# Patient Record
Sex: Male | Born: 1940
Health system: Southern US, Community
[De-identification: ages and names within clinical notes are randomized; demographics above are authoritative.]

## PROBLEM LIST (undated history)

## (undated) DIAGNOSIS — R000 Tachycardia, unspecified: Secondary | ICD-10-CM

## (undated) DIAGNOSIS — I471 Supraventricular tachycardia: Principal | ICD-10-CM

## (undated) DIAGNOSIS — I10 Essential (primary) hypertension: Secondary | ICD-10-CM

## (undated) DIAGNOSIS — E119 Type 2 diabetes mellitus without complications: Secondary | ICD-10-CM

## (undated) DIAGNOSIS — R600 Localized edema: Secondary | ICD-10-CM

## (undated) DIAGNOSIS — T7840XA Allergy, unspecified, initial encounter: Secondary | ICD-10-CM

## (undated) HISTORY — DX: Tachycardia, unspecified: R00.0

## (undated) HISTORY — DX: Supraventricular tachycardia: I47.1

## (undated) HISTORY — DX: Localized edema: R60.0

---

## 2013-06-18 DIAGNOSIS — Z1389 Encounter for screening for other disorder: Secondary | ICD-10-CM | POA: Diagnosis not present

## 2013-06-18 DIAGNOSIS — IMO0002 Reserved for concepts with insufficient information to code with codable children: Secondary | ICD-10-CM | POA: Diagnosis not present

## 2013-06-18 DIAGNOSIS — I10 Essential (primary) hypertension: Secondary | ICD-10-CM | POA: Diagnosis not present

## 2013-09-24 DIAGNOSIS — H4011X0 Primary open-angle glaucoma, stage unspecified: Secondary | ICD-10-CM | POA: Diagnosis not present

## 2013-09-24 DIAGNOSIS — H01009 Unspecified blepharitis unspecified eye, unspecified eyelid: Secondary | ICD-10-CM | POA: Diagnosis not present

## 2013-09-24 DIAGNOSIS — H409 Unspecified glaucoma: Secondary | ICD-10-CM | POA: Diagnosis not present

## 2013-10-01 DIAGNOSIS — H4011X0 Primary open-angle glaucoma, stage unspecified: Secondary | ICD-10-CM | POA: Diagnosis not present

## 2013-10-08 DIAGNOSIS — H409 Unspecified glaucoma: Secondary | ICD-10-CM | POA: Diagnosis not present

## 2013-10-08 DIAGNOSIS — H4011X0 Primary open-angle glaucoma, stage unspecified: Secondary | ICD-10-CM | POA: Diagnosis not present

## 2013-10-15 DIAGNOSIS — H409 Unspecified glaucoma: Secondary | ICD-10-CM | POA: Diagnosis not present

## 2013-10-15 DIAGNOSIS — H4011X0 Primary open-angle glaucoma, stage unspecified: Secondary | ICD-10-CM | POA: Diagnosis not present

## 2013-12-13 DIAGNOSIS — H409 Unspecified glaucoma: Secondary | ICD-10-CM | POA: Diagnosis not present

## 2013-12-13 DIAGNOSIS — H01009 Unspecified blepharitis unspecified eye, unspecified eyelid: Secondary | ICD-10-CM | POA: Diagnosis not present

## 2013-12-13 DIAGNOSIS — H4011X0 Primary open-angle glaucoma, stage unspecified: Secondary | ICD-10-CM | POA: Diagnosis not present

## 2014-01-05 DIAGNOSIS — E559 Vitamin D deficiency, unspecified: Secondary | ICD-10-CM | POA: Diagnosis not present

## 2014-01-05 DIAGNOSIS — E785 Hyperlipidemia, unspecified: Secondary | ICD-10-CM | POA: Diagnosis not present

## 2014-01-05 DIAGNOSIS — E119 Type 2 diabetes mellitus without complications: Secondary | ICD-10-CM | POA: Diagnosis not present

## 2014-01-24 DIAGNOSIS — H01009 Unspecified blepharitis unspecified eye, unspecified eyelid: Secondary | ICD-10-CM | POA: Diagnosis not present

## 2014-01-24 DIAGNOSIS — H409 Unspecified glaucoma: Secondary | ICD-10-CM | POA: Diagnosis not present

## 2014-01-24 DIAGNOSIS — H4011X0 Primary open-angle glaucoma, stage unspecified: Secondary | ICD-10-CM | POA: Diagnosis not present

## 2014-04-04 DIAGNOSIS — Z1211 Encounter for screening for malignant neoplasm of colon: Secondary | ICD-10-CM | POA: Diagnosis not present

## 2014-04-04 DIAGNOSIS — R1011 Right upper quadrant pain: Secondary | ICD-10-CM | POA: Diagnosis not present

## 2014-04-04 DIAGNOSIS — M545 Low back pain: Secondary | ICD-10-CM | POA: Diagnosis not present

## 2014-04-04 DIAGNOSIS — E1165 Type 2 diabetes mellitus with hyperglycemia: Secondary | ICD-10-CM | POA: Diagnosis not present

## 2014-04-11 DIAGNOSIS — E1165 Type 2 diabetes mellitus with hyperglycemia: Secondary | ICD-10-CM | POA: Diagnosis not present

## 2014-04-11 DIAGNOSIS — Z23 Encounter for immunization: Secondary | ICD-10-CM | POA: Diagnosis not present

## 2014-10-11 DIAGNOSIS — E0800 Diabetes mellitus due to underlying condition with hyperosmolarity without nonketotic hyperglycemic-hyperosmolar coma (NKHHC): Secondary | ICD-10-CM | POA: Diagnosis not present

## 2014-10-11 DIAGNOSIS — I10 Essential (primary) hypertension: Secondary | ICD-10-CM | POA: Diagnosis not present

## 2014-10-11 DIAGNOSIS — E559 Vitamin D deficiency, unspecified: Secondary | ICD-10-CM | POA: Diagnosis not present

## 2014-10-11 DIAGNOSIS — G308 Other Alzheimer's disease: Secondary | ICD-10-CM | POA: Diagnosis not present

## 2014-10-11 DIAGNOSIS — H409 Unspecified glaucoma: Secondary | ICD-10-CM | POA: Diagnosis not present

## 2014-10-25 DIAGNOSIS — H409 Unspecified glaucoma: Secondary | ICD-10-CM | POA: Diagnosis not present

## 2014-10-25 DIAGNOSIS — G308 Other Alzheimer's disease: Secondary | ICD-10-CM | POA: Diagnosis not present

## 2014-10-25 DIAGNOSIS — E0800 Diabetes mellitus due to underlying condition with hyperosmolarity without nonketotic hyperglycemic-hyperosmolar coma (NKHHC): Secondary | ICD-10-CM | POA: Diagnosis not present

## 2014-10-25 DIAGNOSIS — I10 Essential (primary) hypertension: Secondary | ICD-10-CM | POA: Diagnosis not present

## 2014-12-06 DIAGNOSIS — H2513 Age-related nuclear cataract, bilateral: Secondary | ICD-10-CM | POA: Diagnosis not present

## 2014-12-06 DIAGNOSIS — H4011X4 Primary open-angle glaucoma, indeterminate stage: Secondary | ICD-10-CM | POA: Diagnosis not present

## 2014-12-26 DIAGNOSIS — I10 Essential (primary) hypertension: Secondary | ICD-10-CM | POA: Diagnosis not present

## 2015-01-21 DIAGNOSIS — H4011X1 Primary open-angle glaucoma, mild stage: Secondary | ICD-10-CM | POA: Diagnosis not present

## 2015-01-21 DIAGNOSIS — H2513 Age-related nuclear cataract, bilateral: Secondary | ICD-10-CM | POA: Diagnosis not present

## 2015-01-21 DIAGNOSIS — E119 Type 2 diabetes mellitus without complications: Secondary | ICD-10-CM | POA: Diagnosis not present

## 2015-02-04 DIAGNOSIS — I10 Essential (primary) hypertension: Secondary | ICD-10-CM | POA: Diagnosis not present

## 2015-02-04 DIAGNOSIS — E0800 Diabetes mellitus due to underlying condition with hyperosmolarity without nonketotic hyperglycemic-hyperosmolar coma (NKHHC): Secondary | ICD-10-CM | POA: Diagnosis not present

## 2015-02-04 DIAGNOSIS — G308 Other Alzheimer's disease: Secondary | ICD-10-CM | POA: Diagnosis not present

## 2015-05-05 DIAGNOSIS — E0800 Diabetes mellitus due to underlying condition with hyperosmolarity without nonketotic hyperglycemic-hyperosmolar coma (NKHHC): Secondary | ICD-10-CM | POA: Diagnosis not present

## 2015-05-05 DIAGNOSIS — E559 Vitamin D deficiency, unspecified: Secondary | ICD-10-CM | POA: Diagnosis not present

## 2015-05-05 DIAGNOSIS — G308 Other Alzheimer's disease: Secondary | ICD-10-CM | POA: Diagnosis not present

## 2015-05-05 DIAGNOSIS — H409 Unspecified glaucoma: Secondary | ICD-10-CM | POA: Diagnosis not present

## 2015-05-05 DIAGNOSIS — I10 Essential (primary) hypertension: Secondary | ICD-10-CM | POA: Diagnosis not present

## 2015-05-05 DIAGNOSIS — E089 Diabetes mellitus due to underlying condition without complications: Secondary | ICD-10-CM | POA: Diagnosis not present

## 2015-07-25 DIAGNOSIS — H401131 Primary open-angle glaucoma, bilateral, mild stage: Secondary | ICD-10-CM | POA: Diagnosis not present

## 2015-07-25 DIAGNOSIS — H2513 Age-related nuclear cataract, bilateral: Secondary | ICD-10-CM | POA: Diagnosis not present

## 2015-07-25 DIAGNOSIS — E119 Type 2 diabetes mellitus without complications: Secondary | ICD-10-CM | POA: Diagnosis not present

## 2015-08-05 DIAGNOSIS — H409 Unspecified glaucoma: Secondary | ICD-10-CM | POA: Diagnosis not present

## 2015-08-05 DIAGNOSIS — E089 Diabetes mellitus due to underlying condition without complications: Secondary | ICD-10-CM | POA: Diagnosis not present

## 2015-08-05 DIAGNOSIS — I10 Essential (primary) hypertension: Secondary | ICD-10-CM | POA: Diagnosis not present

## 2015-08-05 DIAGNOSIS — G308 Other Alzheimer's disease: Secondary | ICD-10-CM | POA: Diagnosis not present

## 2015-12-03 DIAGNOSIS — H409 Unspecified glaucoma: Secondary | ICD-10-CM | POA: Diagnosis not present

## 2015-12-03 DIAGNOSIS — I10 Essential (primary) hypertension: Secondary | ICD-10-CM | POA: Diagnosis not present

## 2015-12-03 DIAGNOSIS — J301 Allergic rhinitis due to pollen: Secondary | ICD-10-CM | POA: Diagnosis not present

## 2015-12-03 DIAGNOSIS — E089 Diabetes mellitus due to underlying condition without complications: Secondary | ICD-10-CM | POA: Diagnosis not present

## 2016-01-23 DIAGNOSIS — H2513 Age-related nuclear cataract, bilateral: Secondary | ICD-10-CM | POA: Diagnosis not present

## 2016-01-23 DIAGNOSIS — H401131 Primary open-angle glaucoma, bilateral, mild stage: Secondary | ICD-10-CM | POA: Diagnosis not present

## 2016-01-23 DIAGNOSIS — E119 Type 2 diabetes mellitus without complications: Secondary | ICD-10-CM | POA: Diagnosis not present

## 2016-04-01 DIAGNOSIS — I10 Essential (primary) hypertension: Secondary | ICD-10-CM | POA: Diagnosis not present

## 2016-04-01 DIAGNOSIS — H409 Unspecified glaucoma: Secondary | ICD-10-CM | POA: Diagnosis not present

## 2016-04-01 DIAGNOSIS — E089 Diabetes mellitus due to underlying condition without complications: Secondary | ICD-10-CM | POA: Diagnosis not present

## 2016-04-01 DIAGNOSIS — G308 Other Alzheimer's disease: Secondary | ICD-10-CM | POA: Diagnosis not present

## 2016-07-16 DIAGNOSIS — E119 Type 2 diabetes mellitus without complications: Secondary | ICD-10-CM | POA: Diagnosis not present

## 2016-07-16 DIAGNOSIS — H2513 Age-related nuclear cataract, bilateral: Secondary | ICD-10-CM | POA: Diagnosis not present

## 2016-07-16 DIAGNOSIS — H401131 Primary open-angle glaucoma, bilateral, mild stage: Secondary | ICD-10-CM | POA: Diagnosis not present

## 2016-07-29 DIAGNOSIS — E0800 Diabetes mellitus due to underlying condition with hyperosmolarity without nonketotic hyperglycemic-hyperosmolar coma (NKHHC): Secondary | ICD-10-CM | POA: Diagnosis not present

## 2016-07-29 DIAGNOSIS — E089 Diabetes mellitus due to underlying condition without complications: Secondary | ICD-10-CM | POA: Diagnosis not present

## 2016-07-29 DIAGNOSIS — I10 Essential (primary) hypertension: Secondary | ICD-10-CM | POA: Diagnosis not present

## 2016-07-29 DIAGNOSIS — H409 Unspecified glaucoma: Secondary | ICD-10-CM | POA: Diagnosis not present

## 2016-10-17 ENCOUNTER — Encounter (HOSPITAL_COMMUNITY): Payer: Self-pay

## 2016-10-17 ENCOUNTER — Observation Stay (HOSPITAL_COMMUNITY)
Admission: EM | Admit: 2016-10-17 | Discharge: 2016-10-18 | Disposition: A | Discharge status: Home / Self Care | Payer: Medicare Other | Attending: Family Medicine | Admitting: Family Medicine

## 2016-10-17 ENCOUNTER — Emergency Department (HOSPITAL_COMMUNITY): Payer: Medicare Other

## 2016-10-17 DIAGNOSIS — I10 Essential (primary) hypertension: Secondary | ICD-10-CM | POA: Diagnosis not present

## 2016-10-17 DIAGNOSIS — J302 Other seasonal allergic rhinitis: Secondary | ICD-10-CM | POA: Insufficient documentation

## 2016-10-17 DIAGNOSIS — R079 Chest pain, unspecified: Secondary | ICD-10-CM

## 2016-10-17 DIAGNOSIS — R0602 Shortness of breath: Secondary | ICD-10-CM | POA: Diagnosis not present

## 2016-10-17 DIAGNOSIS — E119 Type 2 diabetes mellitus without complications: Secondary | ICD-10-CM | POA: Diagnosis not present

## 2016-10-17 DIAGNOSIS — Z8249 Family history of ischemic heart disease and other diseases of the circulatory system: Secondary | ICD-10-CM | POA: Diagnosis not present

## 2016-10-17 DIAGNOSIS — R05 Cough: Secondary | ICD-10-CM | POA: Diagnosis not present

## 2016-10-17 DIAGNOSIS — E118 Type 2 diabetes mellitus with unspecified complications: Secondary | ICD-10-CM | POA: Diagnosis not present

## 2016-10-17 DIAGNOSIS — R0789 Other chest pain: Principal | ICD-10-CM | POA: Insufficient documentation

## 2016-10-17 DIAGNOSIS — Z7984 Long term (current) use of oral hypoglycemic drugs: Secondary | ICD-10-CM | POA: Insufficient documentation

## 2016-10-17 HISTORY — DX: Type 2 diabetes mellitus without complications: E11.9

## 2016-10-17 HISTORY — DX: Essential (primary) hypertension: I10

## 2016-10-17 HISTORY — DX: Allergy, unspecified, initial encounter: T78.40XA

## 2016-10-17 LAB — BASIC METABOLIC PANEL
ANION GAP: 8 (ref 5–15)
BUN: 11 mg/dL (ref 6–20)
CALCIUM: 8.9 mg/dL (ref 8.9–10.3)
CHLORIDE: 104 mmol/L (ref 101–111)
CO2: 25 mmol/L (ref 22–32)
Creatinine, Ser: 0.95 mg/dL (ref 0.61–1.24)
GFR calc non Af Amer: >60 mL/min (ref >60)
Glucose, Bld: 161 mg/dL — ABNORMAL HIGH (ref 65–99)
Potassium: 3.9 mmol/L (ref 3.5–5.1)
Sodium: 137 mmol/L (ref 135–145)

## 2016-10-17 LAB — CBC
HCT: 39.6 % (ref 39.0–52.0)
HEMOGLOBIN: 13.8 g/dL (ref 13.0–17.0)
MCH: 32 pg (ref 26.0–34.0)
MCHC: 34.8 g/dL (ref 30.0–36.0)
MCV: 91.9 fL (ref 78.0–100.0)
Platelets: 210 10*3/uL (ref 150–400)
RBC: 4.31 MIL/uL (ref 4.22–5.81)
RDW: 12.2 % (ref 11.5–15.5)
WBC: 6.2 10*3/uL (ref 4.0–10.5)

## 2016-10-17 LAB — TROPONIN I: Troponin I: <0.03 ng/mL (ref <0.03)

## 2016-10-17 LAB — GLUCOSE, CAPILLARY
GLUCOSE-CAPILLARY: 120 mg/dL — AB (ref 65–99)
GLUCOSE-CAPILLARY: 111 mg/dL — AB (ref 65–99)

## 2016-10-17 LAB — I-STAT TROPONIN, ED
TROPONIN I, POC: 0 ng/mL (ref 0.00–0.08)
TROPONIN I, POC: 0 ng/mL (ref 0.00–0.08)

## 2016-10-17 LAB — LIPID PANEL
Cholesterol: 102 mg/dL (ref 0–200)
HDL: 51 mg/dL (ref >40)
LDL CALC: 44 mg/dL (ref 0–99)
TRIGLYCERIDES: 35 mg/dL (ref <150)
Total CHOL/HDL Ratio: 2 RATIO
VLDL: 7 mg/dL (ref 0–40)

## 2016-10-17 LAB — BRAIN NATRIURETIC PEPTIDE: B NATRIURETIC PEPTIDE 5: 10.6 pg/mL (ref 0.0–100.0)

## 2016-10-17 LAB — D-DIMER, QUANTITATIVE (NOT AT ARMC): D DIMER QUANT: 0.35 ug{FEU}/mL (ref 0.00–0.50)

## 2016-10-17 LAB — TSH: TSH: 0.573 u[IU]/mL (ref 0.350–4.500)

## 2016-10-17 MED ORDER — ENOXAPARIN SODIUM 40 MG/0.4ML ~~LOC~~ SOLN
40.0000 mg | SUBCUTANEOUS | Status: DC
  Administered 2016-10-17: 40 mg via SUBCUTANEOUS
  Filled 2016-10-17: qty 0.4

## 2016-10-17 MED ORDER — ASPIRIN 81 MG PO CHEW
324.0000 mg | CHEWABLE_TABLET | Freq: Once | ORAL | Status: AC
  Administered 2016-10-17: 324 mg via ORAL
  Filled 2016-10-17: qty 4

## 2016-10-17 MED ORDER — MORPHINE SULFATE (PF) 4 MG/ML IV SOLN
2.0000 mg | INTRAVENOUS | Status: DC | PRN
Start: 2016-10-17 — End: 2016-10-18

## 2016-10-17 MED ORDER — GI COCKTAIL ~~LOC~~: 30.0000 mL | Freq: Four times a day (QID) | ORAL | Status: DC | PRN

## 2016-10-17 MED ORDER — GI COCKTAIL ~~LOC~~: 30.0000 mL | Freq: Once | ORAL | Status: DC

## 2016-10-17 MED ORDER — PANTOPRAZOLE SODIUM 40 MG PO TBEC
40.0000 mg | DELAYED_RELEASE_TABLET | Freq: Every day | ORAL | Status: DC
  Administered 2016-10-18: 40 mg via ORAL
  Filled 2016-10-17: qty 1

## 2016-10-17 MED ORDER — ACETAMINOPHEN 325 MG PO TABS: 650.0000 mg | ORAL_TABLET | ORAL | Status: DC | PRN

## 2016-10-17 MED ORDER — ASPIRIN 81 MG PO CHEW
81.0000 mg | CHEWABLE_TABLET | Freq: Every day | ORAL | Status: DC
  Administered 2016-10-18: 81 mg via ORAL
  Filled 2016-10-17: qty 1

## 2016-10-17 MED ORDER — INSULIN ASPART 100 UNIT/ML ~~LOC~~ SOLN: 0.0000 [IU] | Freq: Three times a day (TID) | SUBCUTANEOUS | Status: DC

## 2016-10-17 MED ORDER — HYDROCHLOROTHIAZIDE 12.5 MG PO CAPS
12.5000 mg | ORAL_CAPSULE | Freq: Every day | ORAL | Status: DC
  Administered 2016-10-17 – 2016-10-18 (×2): 12.5 mg via ORAL
  Filled 2016-10-17 (×2): qty 1

## 2016-10-17 MED ORDER — LISINOPRIL 10 MG PO TABS
20.0000 mg | ORAL_TABLET | Freq: Every day | ORAL | Status: DC
  Administered 2016-10-17 – 2016-10-18 (×2): 20 mg via ORAL
  Filled 2016-10-17 (×2): qty 2

## 2016-10-17 MED ORDER — LISINOPRIL-HYDROCHLOROTHIAZIDE 20-12.5 MG PO TABS: 1.0000 | ORAL_TABLET | Freq: Every day | ORAL | Status: DC

## 2016-10-17 MED ORDER — LORATADINE 10 MG PO TABS
10.0000 mg | ORAL_TABLET | Freq: Every day | ORAL | Status: DC
  Administered 2016-10-17 – 2016-10-18 (×2): 10 mg via ORAL
  Filled 2016-10-17 (×2): qty 1

## 2016-10-17 MED ORDER — ONDANSETRON HCL 4 MG/2ML IJ SOLN: 4.0000 mg | Freq: Four times a day (QID) | INTRAMUSCULAR | Status: DC | PRN

## 2016-10-17 NOTE — Plan of Care (Signed)
Problem: Education: Goal: Knowledge of [facility] Education information/materials will improve Outcome: Progressing Slight language barrier, will use interpreter if needed.

## 2016-10-17 NOTE — ED Notes (Signed)
ED Provider at bedside. 

## 2016-10-17 NOTE — ED Triage Notes (Signed)
Patient complains of left anterior chest heaviness with dizziness x 2 days. States that the discomfort is worse in the am. The fullness worth with inspiration. No cough or cold. Also complains of bilateral lower extremity swelling x 1 week.

## 2016-10-17 NOTE — H&P (Signed)
[facility] History and Physical Service Pager: [phone]  Patient name: Gavin PottersJohnchong Hamil Medical record number: [id number] Date of birth: 09/24/1940 Age: 76 y.o. Gender: male  Primary Care Provider: Opal SidlesBlount, Thomas J, MD Consultants: none Code Status: FULL (discussed on admission)  Chief Complaint: Chest pressure  Assessment and Plan: Gavin PottersJohnchong Branum is a 76 y.o. male presenting with chest pressure . PMH is significant for HTN, DM2, seasonal allergies  # Chest pain, atypical.  VSS on room air.  No active CP.  He is sinus rhythm on telemetry. Heart score 4.  iTrop negative x2.  EKG electronic read with nonspecific ST changes.  However, I see no evidence of ischemia and ED provider read as no ischemia.  DDimer 0.35 (neg).  CXR neg for acute processes.  So far, negative workup for ACS.  He has some symptoms that are concerning for uncontrolled GERD, so PPI and GI cocktail have been added. - Place in observation under Dr Jennette KettleNeal - VS per floor protocol - Telemetry - cycle troponin - check lipid, A1c, TSH - repeat EKG in am - if abnormal c/s cards in am otherwise, consider outpatient cards referral for stress test - ASA 81 mg qd, Morphine IV prn - GI cocktail  # HTN: slightly elevated in ED 147/97.  Has not had antihypertensive today - resume Lisinopril/ HCTZ 20/12.5  # DM2: on glimperide 4mg  at home qd - sSSI while hospitalized - BG monitoring QAC.  Can discontinue if controlled.  FEN/GI: SLIV, PPI Prophylaxis: Lovenox sub-q  Disposition: Observation for ACS r/o  History of Present Illness:  Kion Cherly HensenChang is a 76 y.o. male presenting with chest pressure  Patient reports a 3 day history of chest pressure upon waking each morning.  He reports that chest pressure is located on the left side of his chest.  He denies associated N/V, SOB.  Reports some facial sweating this morning.  He notes that CP gradually improves as he goes about his day.  He  denies CP w/ activity or decreased tolerance of physical activity. He reports he is very active, walking up to 3 miles daily. Chest pressure is somewhat better at this time.  He reports bloating and excessive mucus production that is mildly improved with Allegra.     He reports compliance with home medications for DM2 and HTN.  Last doses were yesterday evening.    He reports that his father passed away from MI at age 77.  No other family member of heart disease that he knows of.   He has never been a smoker.  Does not drink ETOH or use illicit substances.  He resides with his wife at home, who is in good health.  He is completely independent in his ADLs and iADLs.  Review Of Systems: Per HPI with the following additions: none  ROS  Patient Active Problem List   Diagnosis Date Noted  . Chest pain 10/17/2016  . Type 2 diabetes mellitus (HCC) 10/17/2016  . Hypertension 10/17/2016  . Seasonal allergies 10/17/2016    Past Medical History: Past Medical History:  Diagnosis Date  . Diabetes mellitus without complication (HCC)   . Hypertension   . seasonal     Past Surgical History: History reviewed. No pertinent surgical history.  Social History: Social History  Substance Use Topics  . Smoking status: Never Smoker  . Smokeless tobacco: Never Used  . Alcohol use No   Additional social history:   Please also refer to relevant sections of  EMR.  Family History: Family History  Problem Relation Age of Onset  . Heart disease Father    Allergies and Medications: No Known Allergies No current facility-administered medications on file prior to encounter.    No current outpatient prescriptions on file prior to encounter.    Objective: BP (!) 147/97   Pulse 81   Temp 98.1 F (36.7 C) (Oral)   Resp (!) 23   Ht 5\' 6"  (1.676 m)   Wt 140 lb (63.5 kg)   SpO2 97%   BMI 22.60 kg/m  Exam: General: awake, alert, well appearing, NAD, lying in bed Eyes: EOMI, sclera white ENTM:  o/p clear, MMM, normal dentition Neck: supple, no JVD, no masses Cardiovascular: RRR, no murmurs Respiratory: CTAB, normal WOB on room air Gastrointestinal: soft, NT/ND, +BS MSK: moves all extremities independently, no edema Derm: skin without lesions Neuro: follows all commands, no focal neurologic deficits Psych: mood stable, speech normal, good eye contact  Labs and Imaging: CBC BMET   Recent Labs Lab 10/17/16 0744  WBC 6.2  HGB 13.8  HCT 39.6  PLT 210    Recent Labs Lab 10/17/16 0744  NA 137  K 3.9  CL 104  CO2 25  BUN 11  CREATININE 0.95  GLUCOSE 161*  CALCIUM 8.9     Cardiac Panel (last 3 results) No results for input(s): CKTOTAL, CKMB, TROPONINI, RELINDX in the last 72 hours.  Dg Chest 2 View  Result Date: 10/17/2016 CLINICAL DATA:  Patient complains of left anterior chest heaviness with dizziness and SOB x 2 days. States that the discomfort is worse in the am. The fullness worse with inspiration. No cough or cold. Also complains of bilateral lower extremity swelling for 1 week. History of diabetes and hypertension. EXAM: CHEST  2 VIEW COMPARISON:  None. FINDINGS: Heart size is normal. Overall cardiomediastinal silhouette is within normal limits in size, with probable age related aortic ectasia. Lungs are clear. No pleural effusion or pneumothorax seen. Mild degenerative spurring noted within the thoracic and upper lumbar spine. No acute or suspicious osseous finding. IMPRESSION: No active cardiopulmonary disease.  No evidence of pneumonia or CHF. Electronically Signed   By: Bary Richard M.D.   On: 10/17/2016 07:47    Raliegh Ip, DO 10/17/2016, 1:54 PM PGY-3, [facility] FPTS Intern pager: [phone], text pages welcome

## 2016-10-17 NOTE — ED Notes (Signed)
Attempted to call report x 1  

## 2016-10-17 NOTE — ED Provider Notes (Signed)
[facility]-EMERGENCY DEPT Provider Note   CSN: [id number] Arrival date & time: 10/17/16  0720     History   Chief Complaint Chief Complaint  Patient presents with  . Chest Pain  . Dizziness    HPI Wayne Walker is a 76 y.o. male.  The history is provided by the patient.  Chest Pain   This is a new problem. The current episode started more than 2 days ago. The problem occurs daily. The problem has been gradually improving. The pain is associated with breathing. The pain is present in the lateral region. The pain is mild. The quality of the pain is described as dull and pressure-like. The pain does not radiate. Duration of episode(s) is 20 minutes. The symptoms are aggravated by deep breathing. Associated symptoms include cough, dizziness, lower extremity edema and shortness of breath. Pertinent negatives include no diaphoresis, no fever, no leg pain, no nausea, no sputum production, no syncope and no vomiting. He has tried nothing for the symptoms. The treatment provided no relief. Risk factors include being elderly and male gender.  His past medical history is significant for diabetes and hypertension.  His family medical history is significant for CAD.  Dizziness  Associated symptoms: chest pain and shortness of breath   Associated symptoms: no nausea, no syncope and no vomiting    76 year old male who presents with chest pain. He has a history of hypertension and diabetes. No prior history of cardiac problems. States that over the past 2-3 days he has been waking up with left-sided chest pain that is dull in nature. He feels it is more uncomfortable when he takes a deep breath in. Pain is nonradiating. It is not associated with nausea, vomiting, diaphoresis. States that he has been having dizziness, but this has been ongoing for several years and unchanged. Has noted some intermittent lower extremity/pedal edema. No orthopnea or PND. Mild cough but no fevers or chills or other upper  respiratory symptoms. Symptoms typically last for 20 minutes or so and then self resolved. States that he does stair climbing for exercise normally, and has not had similar symptoms with that. No recent immobilization. No prior history of PE/DVT or family history of that.  Past Medical History:  Diagnosis Date  . Diabetes mellitus without complication (HCC)   . Hypertension     There are no active problems to display for this patient.   History reviewed. No pertinent surgical history.     Home Medications    Prior to Admission medications   Medication Sig Start Date End Date Taking? Authorizing Provider  Fexofenadine HCl (ALLEGRA PO) Take 1 tablet by mouth daily as needed (for allergies).   Yes [provider]  glimepiride (AMARYL) 4 MG tablet Take 4 mg by mouth daily. 08/21/16  Yes [provider]  lisinopril-hydrochlorothiazide (PRINZIDE,ZESTORETIC) 20-12.5 MG tablet Take 1 tablet by mouth daily. 10/01/16  Yes [provider]  LUMIGAN 0.01 % SOLN Place 1 drop into both eyes 2 (two) times a week.  08/05/16  Yes [provider]    Family History No family history on file. Father with multiple MI's and died at age 60  Social History Social History  Substance Use Topics  . Smoking status: Never Smoker  . Smokeless tobacco: Never Used  . Alcohol use Not on file     Allergies   Patient has no known allergies.   Review of Systems Review of Systems  Constitutional: Negative for diaphoresis and fever.  HENT: Negative for  congestion.   Respiratory: Positive for cough and shortness of breath. Negative for sputum production.   Cardiovascular: Positive for chest pain. Negative for syncope.  Gastrointestinal: Negative for nausea and vomiting.  Neurological: Positive for dizziness.  All other systems reviewed and are negative.    Physical Exam Updated Vital Signs BP 130/74   Pulse 78   Temp 98.1 F (36.7 C) (Oral)   Resp 13   Ht 5\' 6"   (1.676 m)   Wt 140 lb (63.5 kg)   SpO2 98%   BMI 22.60 kg/m   Physical Exam Physical Exam  Nursing note and vitals reviewed. Constitutional: Well developed, well nourished, non-toxic, and in no acute distress Head: Normocephalic and atraumatic.  Mouth/Throat: Oropharynx is clear and moist.  Neck: Normal range of motion. Neck supple.  Cardiovascular: Normal rate and regular rhythm.   Pulmonary/Chest: Effort normal and breath sounds normal.  Abdominal: Soft. There is no tenderness. There is no rebound and no guarding.  Musculoskeletal: Normal range of motion. no significant LE edema Neurological: Alert, no facial droop, fluent speech, moves all extremities symmetrically Skin: Skin is warm and dry.  Psychiatric: Cooperative   ED Treatments / Results  Labs (all labs ordered are listed, but only abnormal results are displayed) Labs Reviewed  BASIC METABOLIC PANEL - Abnormal; Notable for the following:       Result Value   Glucose, Bld 161 (*)    All other components within normal limits  CBC  BRAIN NATRIURETIC PEPTIDE  D-DIMER, QUANTITATIVE (NOT AT [facility])  I-STAT TROPOININ, ED  Rosezena Sensor, ED    EKG  EKG Interpretation None       Radiology Dg Chest 2 View  Result Date: 10/17/2016 CLINICAL DATA:  Patient complains of left anterior chest heaviness with dizziness and SOB x 2 days. States that the discomfort is worse in the am. The fullness worse with inspiration. No cough or cold. Also complains of bilateral lower extremity swelling for 1 week. History of diabetes and hypertension. EXAM: CHEST  2 VIEW COMPARISON:  None. FINDINGS: Heart size is normal. Overall cardiomediastinal silhouette is within normal limits in size, with probable age related aortic ectasia. Lungs are clear. No pleural effusion or pneumothorax seen. Mild degenerative spurring noted within the thoracic and upper lumbar spine. No acute or suspicious osseous finding. IMPRESSION: No active cardiopulmonary  disease.  No evidence of pneumonia or CHF. Electronically Signed   By: Bary Richard M.D.   On: 10/17/2016 07:47    Procedures Procedures (including critical care time)  Medications Ordered in ED Medications  aspirin chewable tablet 324 mg (324 mg Oral Given 10/17/16 0829)     Initial Impression / Assessment and Plan / ED Course  I have reviewed the triage vital signs and the nursing notes.  Pertinent labs & imaging results that were available during my care of the patient were reviewed by me and considered in my medical decision making (see chart for details).     76 year old male who presents with chest pain. He is chest pain-free on arrival. Nontoxic in no acute distress with normal vital signs. EKG is not acutely ischemic. Chest x-ray is visualized and shows no acute cardio pulmonary processes. Troponin is normal. Overall heart score of 4, other there are some atypical features of this pain. Plan to admit for cardiac rule out. D-dimer is negative given the questionable pleuritic nature of his pain, and he is felt to be ruled out for PE. Discussed with family medicine teaching service  who will admit for ongoing management.  Final Clinical Impressions(s) / ED Diagnoses   Final diagnoses:  Nonspecific chest pain    New Prescriptions New Prescriptions   No medications on file     Lavera GuiseLiu, Monseratt Ledin Duo, MD 10/17/16 1257

## 2016-10-18 DIAGNOSIS — R079 Chest pain, unspecified: Secondary | ICD-10-CM | POA: Diagnosis not present

## 2016-10-18 DIAGNOSIS — Z7984 Long term (current) use of oral hypoglycemic drugs: Secondary | ICD-10-CM | POA: Diagnosis not present

## 2016-10-18 DIAGNOSIS — I10 Essential (primary) hypertension: Secondary | ICD-10-CM | POA: Diagnosis not present

## 2016-10-18 DIAGNOSIS — J302 Other seasonal allergic rhinitis: Secondary | ICD-10-CM | POA: Diagnosis not present

## 2016-10-18 DIAGNOSIS — R0789 Other chest pain: Secondary | ICD-10-CM | POA: Diagnosis not present

## 2016-10-18 DIAGNOSIS — Z8249 Family history of ischemic heart disease and other diseases of the circulatory system: Secondary | ICD-10-CM | POA: Diagnosis not present

## 2016-10-18 DIAGNOSIS — R0602 Shortness of breath: Secondary | ICD-10-CM | POA: Diagnosis not present

## 2016-10-18 DIAGNOSIS — E119 Type 2 diabetes mellitus without complications: Secondary | ICD-10-CM | POA: Diagnosis not present

## 2016-10-18 LAB — TROPONIN I

## 2016-10-18 LAB — HEMOGLOBIN A1C
Hgb A1c MFr Bld: 6.6 % — ABNORMAL HIGH (ref 4.8–5.6)
Mean Plasma Glucose: 143 mg/dL

## 2016-10-18 LAB — GLUCOSE, CAPILLARY
GLUCOSE-CAPILLARY: 137 mg/dL — AB (ref 65–99)
Glucose-Capillary: 186 mg/dL — ABNORMAL HIGH (ref 65–99)

## 2016-10-18 MED ORDER — ATORVASTATIN CALCIUM 40 MG PO TABS: 40.0000 mg | ORAL_TABLET | Freq: Every day | ORAL | Status: DC

## 2016-10-18 MED ORDER — ATORVASTATIN CALCIUM 40 MG PO TABS: 40.0000 mg | ORAL_TABLET | Freq: Every day | ORAL | 0 refills | Status: AC

## 2016-10-18 MED ORDER — ASPIRIN 81 MG PO CHEW: 81.0000 mg | CHEWABLE_TABLET | Freq: Every day | ORAL | 0 refills | Status: AC

## 2016-10-18 NOTE — Progress Notes (Signed)
Family Medicine Teaching Service Daily Progress Note Intern Pager: [phone]  Patient name: Wayne Walker Denunzio Medical record number: [id number] Date of birth: 02/08/1941 Age: 76 y.o. Gender: male  Primary Care Provider: Opal SidlesBlount, Thomas J, MD Consultants: None Code Status: Full  Pt Overview and Major Events to Date:    Assessment and Plan: Wayne Walker Petrasek is a 76 y.o. male presenting with chest pressure . PMH is significant for HTN, DM2, seasonal allergies  Chest pain, atypical.  VSS on room air.  No active CP.  ACS ruled out with negative troponin x3, EKG electronically read as nonspecific ST changes but no evidence for ischemia per our teams read. He is sinus rhythm on telemetry. CXR negative. He has some symptoms that are concerning for uncontrolled GERD, so PPI and GI cocktail have been added. - risk stratification labs (A1c pending) - repeat EKG pending - ASA 81 mg qd, Morphine IV prn - GI cocktail - will start atorvastatin 40 mg ASCVD 16.5% - discussed with patient, he prefers outpatient referral to cardiology. Will put this on his DC summary  HTN: normotensive this AM. - continue home Lisinopril/ HCTZ 20/12.5  DM2: on glimperide 4mg  at home qd. CBGs 137,161 overnight - sSSI while hospitalized - BG monitoring QAC.  Can discontinue if controlled.  FEN/GI: SLIV, PPI Prophylaxis: Lovenox sub-q  Disposition: Observation for ACS r/o   Subjective:  Patient walking around the room, sits for my exam. Denies CP, notes dyspnea overnight worse with lying flat but better with extra pillows under his head.  Objective: Temp:  [97.6 F (36.4 C)-98.7 F (37.1 C)] 98.7 F (37.1 C) (05/14 0600) Pulse Rate:  [68-103] 103 (05/14 0600) Resp:  [7-23] 16 (05/14 0600) BP: (104-172)/(71-97) 104/71 (05/14 0600) SpO2:  [94 %-99 %] 97 % (05/14 0600) Weight:  [63.5 kg (140 lb)] 63.5 kg (140 lb) (05/13 0728) Physical Exam: General: NAD, comfortable, nontoxic Cardiovascular: RRR, no  m/r/g Respiratory: CTA bil, no W/R/R Abdomen: soft and nontender, nondistendced Extremities: no LE edema appreciated  Laboratory:  Troponin negative x3 TSH 0.573 Lipid Chol 102 Trig 35 HDL 51 LDL 44   Recent Labs Lab 10/17/16 0744  WBC 6.2  HGB 13.8  HCT 39.6  PLT 210    Recent Labs Lab 10/17/16 0744  NA 137  K 3.9  CL 104  CO2 25  BUN 11  CREATININE 0.95  CALCIUM 8.9  GLUCOSE 161*     Imaging/Diagnostic Tests: No results found.   Howard PouchFeng, Deakon Frix, MD 10/18/2016, 7:10 AM PGY-1, [facility] FPTS Intern pager: [phone], text pages welcome

## 2016-10-18 NOTE — Discharge Summary (Signed)
Family Medicine Teaching [facility] Discharge Summary  Patient name: Wayne Walker Medical record number: [id number] Date of birth: [birth year] Age: 76 y.o. Gender: male Date of Admission: 10/17/2016  Date of Discharge: 10/18/2016 Admitting Physician: Nestor Ramp, MD  Primary Care Provider: Opal Sidles, MD Consultants: None  Indication for Hospitalization: Chest Pain/ACS rule out  Discharge Diagnoses/Problem List:  Patient Active Problem List   Diagnosis Date Noted  . Nonspecific chest pain 10/17/2016  . Type 2 diabetes mellitus (HCC) 10/17/2016  . Hypertension 10/17/2016  . Seasonal allergies 10/17/2016     Disposition: Home  Discharge Condition: Stable  Discharge Exam:  Temp:  [97.6 F (36.4 C)-98.7 F (37.1 C)] 98.7 F (37.1 C) (05/14 0600) Pulse Rate:  [68-103] 103 (05/14 0600) Resp:  [7-23] 16 (05/14 0600) BP: (104-172)/(71-97) 104/71 (05/14 0600) SpO2:  [94 %-99 %] 97 % (05/14 0600) Weight:  [63.5 kg (140 lb)] 63.5 kg (140 lb) (05/13 0728) Physical Exam: General: NAD, comfortable, nontoxic Cardiovascular: RRR, no m/r/g Respiratory: CTA bil, no W/R/R Abdomen: soft and nontender, nondistendced Extremities: no LE edema appreciated  Brief Hospital Course:  Wayne Changis a 76 y.o.malepresenting with chest pressure. PMH is significant for HTN, DM2, seasonal allergies.  Patient presented with 3 day history of chest pressure upon waking each morning. Located on the left side of his chest, not associated with nausea or vomiting, no dyspnea. Chest pain improves as he goes throughout his day. Patient was admitted for rule out of acute coronary syndrome. He was found to have troponin negative x3 and EKG without ischemic changes.   ASCVD risk was calculated to be 16.5% and so he was discharged with Atorvastatin 40 mg to take daily and a daily aspirin 81 mg. Patient was offered a cardiology consult while in the hospital, however would prefer to follow up with  cardiology outpatient.  He was considered stable for discharge.   Issues for Follow Up:  1. Consider outpatient referral to cardiology 2. Patient was discharged with new medications: Atorvastatin 40 mg daily and ASA 81 mg daily 3. Risk stratification labs were completed and are listed below 4. HbA1c was pending at time of discharge  Significant Procedures: None  Significant Labs and Imaging:   Troponin negative x3 TSH 0.573 Lipid Chol 102 Trig 35 HDL 51 LDL 44  Recent Labs Lab 10/17/16 0744  WBC 6.2  HGB 13.8  HCT 39.6  PLT 210    Recent Labs Lab 10/17/16 0744  NA 137  K 3.9  CL 104  CO2 25  GLUCOSE 161*  BUN 11  CREATININE 0.95  CALCIUM 8.9     Results/Tests Pending at Time of Discharge: HbA1c  Discharge Medications:  Allergies as of 10/18/2016   No Known Allergies     Medication List    TAKE these medications   ALLEGRA PO Take 1 tablet by mouth daily as needed (for allergies).   aspirin 81 MG chewable tablet Chew 1 tablet (81 mg total) by mouth daily. Start taking on:  10/19/2016   atorvastatin 40 MG tablet Commonly known as:  LIPITOR Take 1 tablet (40 mg total) by mouth daily at 6 PM.   glimepiride 4 MG tablet Commonly known as:  AMARYL Take 4 mg by mouth daily.   lisinopril-hydrochlorothiazide 20-12.5 MG tablet Commonly known as:  PRINZIDE,ZESTORETIC Take 1 tablet by mouth daily.   LUMIGAN 0.01 % Soln Generic drug:  bimatoprost Place 1 drop into both eyes 2 (two) times a week.  Discharge Instructions: Please refer to Patient Instructions section of EMR for full details.  Patient was counseled important signs and symptoms that should prompt return to medical care, changes in medications, dietary instructions, activity restrictions, and follow up appointments.   Follow-Up Appointments: Follow-up Information    Blount, Ihor Austinhomas J, MD. Schedule an appointment as soon as possible for a visit in 1 week(s).   Contact information: [address] [place] [address] [phone]           Howard PouchFeng, Dreyah Montrose, MD 10/18/2016, 10:28 PM PGY-1, [facility]

## 2016-10-18 NOTE — Progress Notes (Signed)
Pt. Discharged to home Pt. D/C'd wanted to walk, but escorted Discharge information reviewed and given All personal belongings given to Pt.  Education discussed with teach back IV was d/c Tele d/c

## 2016-10-18 NOTE — Discharge Instructions (Signed)
You were hospitalized due to chest pain. You had labs done here in the hospital and EKG that showed that you did not have a heart attack. You elected to see a cardiologist as an outpatient, and your medical team believes this is an appropriate course of action.  Please call your regular doctor and schedule an appointment to be seen within the next week. You were prescribed new medications at discharge. Please review your medication list carefully and pick up your prescriptions from your pharmacy.

## 2016-11-02 DIAGNOSIS — E119 Type 2 diabetes mellitus without complications: Secondary | ICD-10-CM | POA: Diagnosis not present

## 2016-11-02 DIAGNOSIS — R079 Chest pain, unspecified: Secondary | ICD-10-CM | POA: Diagnosis not present

## 2016-11-16 ENCOUNTER — Other Ambulatory Visit: Payer: Self-pay | Admitting: Student

## 2016-11-29 DIAGNOSIS — R0789 Other chest pain: Secondary | ICD-10-CM | POA: Diagnosis not present

## 2016-11-29 DIAGNOSIS — Z87891 Personal history of nicotine dependence: Secondary | ICD-10-CM | POA: Diagnosis not present

## 2016-11-29 DIAGNOSIS — Z0189 Encounter for other specified special examinations: Secondary | ICD-10-CM | POA: Diagnosis not present

## 2016-11-29 DIAGNOSIS — I10 Essential (primary) hypertension: Secondary | ICD-10-CM | POA: Diagnosis not present

## 2016-11-30 DIAGNOSIS — H409 Unspecified glaucoma: Secondary | ICD-10-CM | POA: Diagnosis not present

## 2016-11-30 DIAGNOSIS — E089 Diabetes mellitus due to underlying condition without complications: Secondary | ICD-10-CM | POA: Diagnosis not present

## 2016-11-30 DIAGNOSIS — I10 Essential (primary) hypertension: Secondary | ICD-10-CM | POA: Diagnosis not present

## 2016-12-06 DIAGNOSIS — R0789 Other chest pain: Secondary | ICD-10-CM | POA: Diagnosis not present

## 2016-12-06 DIAGNOSIS — I10 Essential (primary) hypertension: Secondary | ICD-10-CM | POA: Diagnosis not present

## 2016-12-06 DIAGNOSIS — E119 Type 2 diabetes mellitus without complications: Secondary | ICD-10-CM | POA: Diagnosis not present

## 2016-12-28 DIAGNOSIS — R0789 Other chest pain: Secondary | ICD-10-CM | POA: Diagnosis not present

## 2016-12-28 DIAGNOSIS — R000 Tachycardia, unspecified: Secondary | ICD-10-CM | POA: Diagnosis not present

## 2016-12-28 DIAGNOSIS — Z0189 Encounter for other specified special examinations: Secondary | ICD-10-CM | POA: Diagnosis not present

## 2016-12-28 DIAGNOSIS — I10 Essential (primary) hypertension: Secondary | ICD-10-CM | POA: Diagnosis not present

## 2016-12-30 DIAGNOSIS — E1100 Type 2 diabetes mellitus with hyperosmolarity without nonketotic hyperglycemic-hyperosmolar coma (NKHHC): Secondary | ICD-10-CM | POA: Diagnosis not present

## 2016-12-30 DIAGNOSIS — I519 Heart disease, unspecified: Secondary | ICD-10-CM | POA: Diagnosis not present

## 2016-12-30 DIAGNOSIS — I10 Essential (primary) hypertension: Secondary | ICD-10-CM | POA: Diagnosis not present

## 2017-01-14 DIAGNOSIS — E119 Type 2 diabetes mellitus without complications: Secondary | ICD-10-CM | POA: Diagnosis not present

## 2017-01-14 DIAGNOSIS — H401411 Capsular glaucoma with pseudoexfoliation of lens, right eye, mild stage: Secondary | ICD-10-CM | POA: Diagnosis not present

## 2017-01-14 DIAGNOSIS — H2513 Age-related nuclear cataract, bilateral: Secondary | ICD-10-CM | POA: Diagnosis not present

## 2017-01-14 DIAGNOSIS — H401131 Primary open-angle glaucoma, bilateral, mild stage: Secondary | ICD-10-CM | POA: Diagnosis not present

## 2017-02-16 DIAGNOSIS — I471 Supraventricular tachycardia: Secondary | ICD-10-CM | POA: Diagnosis not present

## 2017-02-16 DIAGNOSIS — R0789 Other chest pain: Secondary | ICD-10-CM | POA: Diagnosis not present

## 2017-02-16 DIAGNOSIS — I10 Essential (primary) hypertension: Secondary | ICD-10-CM | POA: Diagnosis not present

## 2017-02-16 DIAGNOSIS — R000 Tachycardia, unspecified: Secondary | ICD-10-CM | POA: Diagnosis not present

## 2017-03-31 DIAGNOSIS — I10 Essential (primary) hypertension: Secondary | ICD-10-CM | POA: Diagnosis not present

## 2017-03-31 DIAGNOSIS — E118 Type 2 diabetes mellitus with unspecified complications: Secondary | ICD-10-CM | POA: Diagnosis not present

## 2017-03-31 DIAGNOSIS — E119 Type 2 diabetes mellitus without complications: Secondary | ICD-10-CM | POA: Diagnosis not present

## 2017-03-31 DIAGNOSIS — I519 Heart disease, unspecified: Secondary | ICD-10-CM | POA: Diagnosis not present

## 2017-05-12 ENCOUNTER — Other Ambulatory Visit: Payer: Self-pay

## 2017-06-27 ENCOUNTER — Other Ambulatory Visit: Payer: Self-pay | Admitting: Student

## 2017-07-19 DIAGNOSIS — H401131 Primary open-angle glaucoma, bilateral, mild stage: Secondary | ICD-10-CM | POA: Diagnosis not present

## 2017-07-19 DIAGNOSIS — H2513 Age-related nuclear cataract, bilateral: Secondary | ICD-10-CM | POA: Diagnosis not present

## 2017-07-19 DIAGNOSIS — E119 Type 2 diabetes mellitus without complications: Secondary | ICD-10-CM | POA: Diagnosis not present

## 2017-07-19 DIAGNOSIS — H401411 Capsular glaucoma with pseudoexfoliation of lens, right eye, mild stage: Secondary | ICD-10-CM | POA: Diagnosis not present

## 2017-07-28 DIAGNOSIS — I519 Heart disease, unspecified: Secondary | ICD-10-CM | POA: Diagnosis not present

## 2017-07-28 DIAGNOSIS — H409 Unspecified glaucoma: Secondary | ICD-10-CM | POA: Diagnosis not present

## 2017-07-28 DIAGNOSIS — H4040X0 Glaucoma secondary to eye inflammation, unspecified eye, stage unspecified: Secondary | ICD-10-CM | POA: Diagnosis not present

## 2017-07-28 DIAGNOSIS — I10 Essential (primary) hypertension: Secondary | ICD-10-CM | POA: Diagnosis not present

## 2017-07-28 DIAGNOSIS — E118 Type 2 diabetes mellitus with unspecified complications: Secondary | ICD-10-CM | POA: Diagnosis not present

## 2017-09-02 DIAGNOSIS — I10 Essential (primary) hypertension: Secondary | ICD-10-CM | POA: Diagnosis not present

## 2017-09-02 DIAGNOSIS — I519 Heart disease, unspecified: Secondary | ICD-10-CM | POA: Diagnosis not present

## 2017-09-02 DIAGNOSIS — H409 Unspecified glaucoma: Secondary | ICD-10-CM | POA: Diagnosis not present

## 2017-09-02 DIAGNOSIS — E118 Type 2 diabetes mellitus with unspecified complications: Secondary | ICD-10-CM | POA: Diagnosis not present

## 2017-11-01 DIAGNOSIS — I119 Hypertensive heart disease without heart failure: Secondary | ICD-10-CM | POA: Diagnosis not present

## 2017-11-01 DIAGNOSIS — B353 Tinea pedis: Secondary | ICD-10-CM | POA: Diagnosis not present

## 2017-11-01 DIAGNOSIS — E7801 Familial hypercholesterolemia: Secondary | ICD-10-CM | POA: Diagnosis not present

## 2017-11-01 DIAGNOSIS — E1100 Type 2 diabetes mellitus with hyperosmolarity without nonketotic hyperglycemic-hyperosmolar coma (NKHHC): Secondary | ICD-10-CM | POA: Diagnosis not present

## 2017-11-16 DIAGNOSIS — H401411 Capsular glaucoma with pseudoexfoliation of lens, right eye, mild stage: Secondary | ICD-10-CM | POA: Diagnosis not present

## 2017-11-16 DIAGNOSIS — E119 Type 2 diabetes mellitus without complications: Secondary | ICD-10-CM | POA: Diagnosis not present

## 2017-11-16 DIAGNOSIS — H2513 Age-related nuclear cataract, bilateral: Secondary | ICD-10-CM | POA: Diagnosis not present

## 2017-11-16 DIAGNOSIS — H401131 Primary open-angle glaucoma, bilateral, mild stage: Secondary | ICD-10-CM | POA: Diagnosis not present

## 2017-12-15 DIAGNOSIS — Z0000 Encounter for general adult medical examination without abnormal findings: Secondary | ICD-10-CM | POA: Diagnosis not present

## 2018-02-16 DIAGNOSIS — I471 Supraventricular tachycardia: Secondary | ICD-10-CM | POA: Diagnosis not present

## 2018-02-16 DIAGNOSIS — R000 Tachycardia, unspecified: Secondary | ICD-10-CM | POA: Diagnosis not present

## 2018-02-16 DIAGNOSIS — I10 Essential (primary) hypertension: Secondary | ICD-10-CM | POA: Diagnosis not present

## 2018-02-16 DIAGNOSIS — R600 Localized edema: Secondary | ICD-10-CM | POA: Diagnosis not present

## 2018-02-28 DIAGNOSIS — I10 Essential (primary) hypertension: Secondary | ICD-10-CM | POA: Diagnosis not present

## 2018-02-28 DIAGNOSIS — H409 Unspecified glaucoma: Secondary | ICD-10-CM | POA: Diagnosis not present

## 2018-02-28 DIAGNOSIS — I119 Hypertensive heart disease without heart failure: Secondary | ICD-10-CM | POA: Diagnosis not present

## 2018-02-28 DIAGNOSIS — E7801 Familial hypercholesterolemia: Secondary | ICD-10-CM | POA: Diagnosis not present

## 2018-02-28 DIAGNOSIS — E1100 Type 2 diabetes mellitus with hyperosmolarity without nonketotic hyperglycemic-hyperosmolar coma (NKHHC): Secondary | ICD-10-CM | POA: Diagnosis not present

## 2018-03-23 DIAGNOSIS — R600 Localized edema: Secondary | ICD-10-CM | POA: Diagnosis not present

## 2018-03-23 DIAGNOSIS — I10 Essential (primary) hypertension: Secondary | ICD-10-CM | POA: Diagnosis not present

## 2018-04-13 DIAGNOSIS — Z6821 Body mass index (BMI) 21.0-21.9, adult: Secondary | ICD-10-CM | POA: Diagnosis not present

## 2018-04-13 DIAGNOSIS — E119 Type 2 diabetes mellitus without complications: Secondary | ICD-10-CM | POA: Diagnosis not present

## 2018-04-13 DIAGNOSIS — I119 Hypertensive heart disease without heart failure: Secondary | ICD-10-CM | POA: Diagnosis not present

## 2018-04-13 DIAGNOSIS — D649 Anemia, unspecified: Secondary | ICD-10-CM | POA: Diagnosis not present

## 2018-04-27 ENCOUNTER — Other Ambulatory Visit: Payer: Self-pay

## 2018-05-09 DIAGNOSIS — Z1211 Encounter for screening for malignant neoplasm of colon: Secondary | ICD-10-CM | POA: Diagnosis not present

## 2018-05-13 IMAGING — CR DG CHEST 2V
2 series · 2 of 2 positions shown · non-contrast
Comparison: None.

CLINICAL DATA: Patient complains of left anterior chest heaviness
with dizziness and SOB x 2 days. States that the discomfort is worse
in the am. The fullness worse with inspiration. No cough or cold.
Also complains of bilateral lower extremity swelling for 1 week.
History of diabetes and hypertension.

EXAM:
CHEST  2 VIEW

[chest pa]
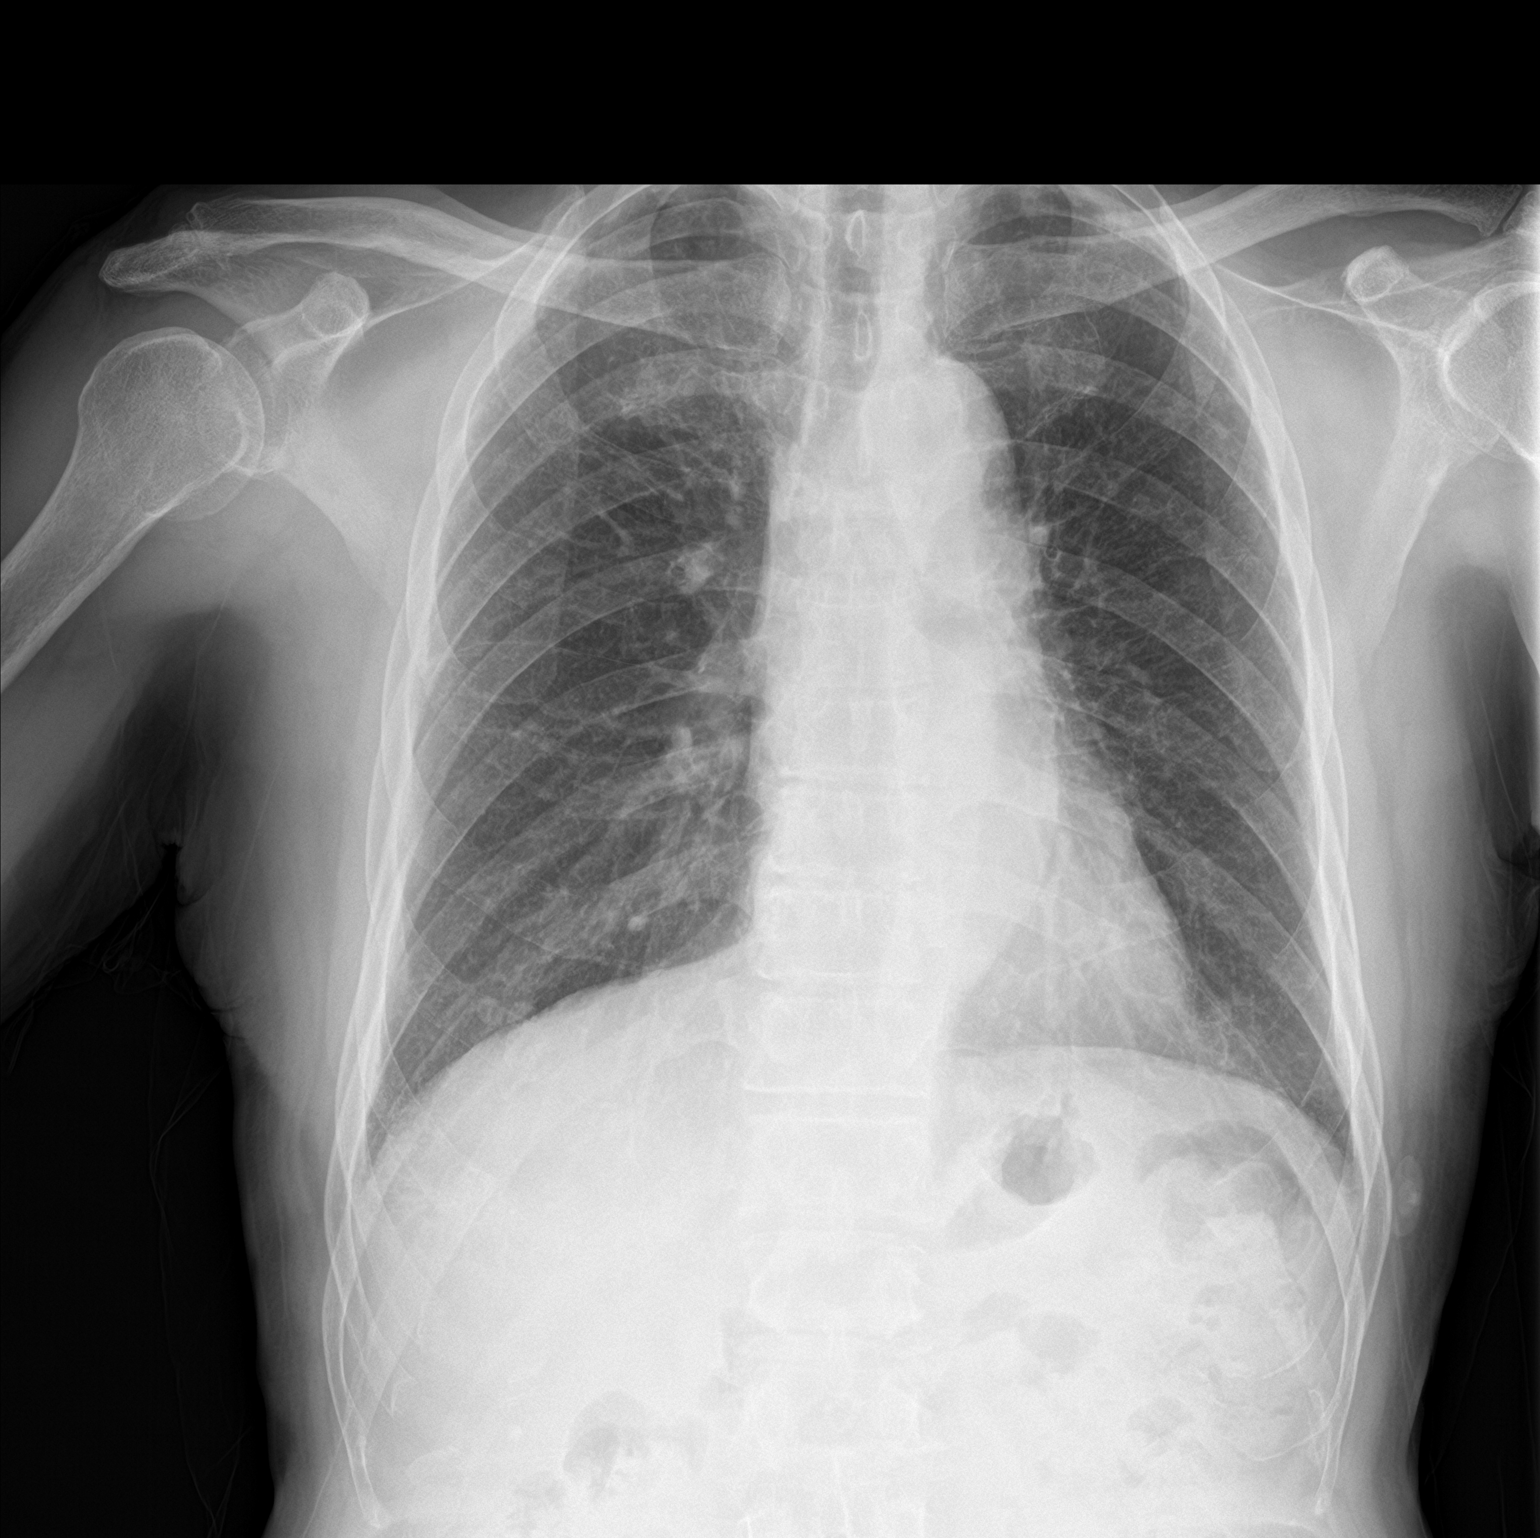

[chest lat]
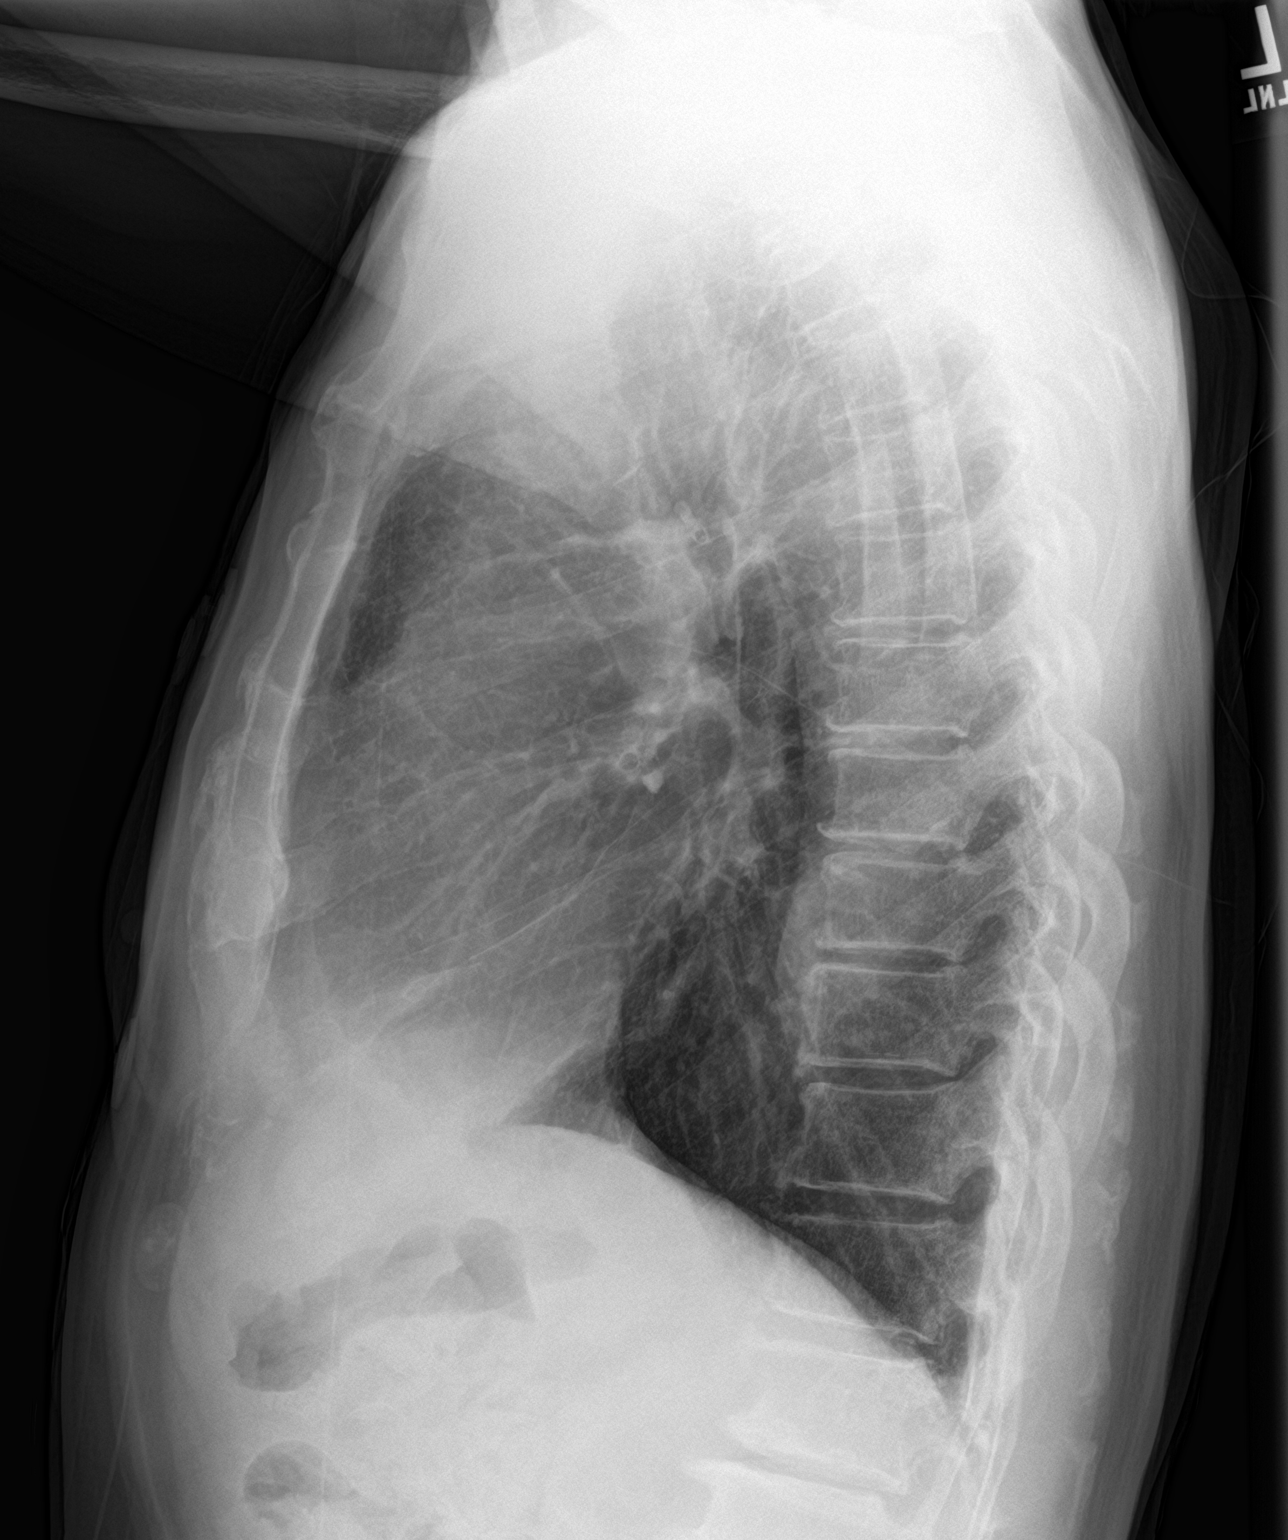

[2 of 2 positions shown; findings below may reference images not displayed]

FINDINGS: Heart size is normal. Overall cardiomediastinal silhouette is within
normal limits in size, with probable age related aortic ectasia.

Lungs are clear. No pleural effusion or pneumothorax seen. Mild
degenerative spurring noted within the thoracic and upper lumbar
spine. No acute or suspicious osseous finding.
IMPRESSION: No active cardiopulmonary disease.  No evidence of pneumonia or CHF.

## 2018-05-23 DIAGNOSIS — H401131 Primary open-angle glaucoma, bilateral, mild stage: Secondary | ICD-10-CM | POA: Diagnosis not present

## 2018-05-23 DIAGNOSIS — E119 Type 2 diabetes mellitus without complications: Secondary | ICD-10-CM | POA: Diagnosis not present

## 2018-05-23 DIAGNOSIS — H5703 Miosis: Secondary | ICD-10-CM | POA: Diagnosis not present

## 2018-05-23 DIAGNOSIS — H401411 Capsular glaucoma with pseudoexfoliation of lens, right eye, mild stage: Secondary | ICD-10-CM | POA: Diagnosis not present

## 2018-05-23 DIAGNOSIS — H2513 Age-related nuclear cataract, bilateral: Secondary | ICD-10-CM | POA: Diagnosis not present

## 2018-05-25 DIAGNOSIS — E7801 Familial hypercholesterolemia: Secondary | ICD-10-CM | POA: Diagnosis not present

## 2018-05-25 DIAGNOSIS — Z6821 Body mass index (BMI) 21.0-21.9, adult: Secondary | ICD-10-CM | POA: Diagnosis not present

## 2018-05-25 DIAGNOSIS — I119 Hypertensive heart disease without heart failure: Secondary | ICD-10-CM | POA: Diagnosis not present

## 2018-05-25 DIAGNOSIS — E119 Type 2 diabetes mellitus without complications: Secondary | ICD-10-CM | POA: Diagnosis not present

## 2018-08-21 ENCOUNTER — Ambulatory Visit (INDEPENDENT_AMBULATORY_CARE_PROVIDER_SITE_OTHER): Payer: Medicare Other | Admitting: Cardiology

## 2018-08-21 ENCOUNTER — Other Ambulatory Visit: Payer: Self-pay

## 2018-08-21 ENCOUNTER — Encounter: Payer: Self-pay | Admitting: Cardiology

## 2018-08-21 VITALS — BP 130/79 | HR 96 | Ht 65.0 in | Wt 136.6 lb

## 2018-08-21 DIAGNOSIS — R739 Hyperglycemia, unspecified: Secondary | ICD-10-CM

## 2018-08-21 DIAGNOSIS — R000 Tachycardia, unspecified: Secondary | ICD-10-CM | POA: Diagnosis not present

## 2018-08-21 DIAGNOSIS — Z0189 Encounter for other specified special examinations: Secondary | ICD-10-CM

## 2018-08-21 DIAGNOSIS — R600 Localized edema: Secondary | ICD-10-CM

## 2018-08-21 DIAGNOSIS — I471 Supraventricular tachycardia: Secondary | ICD-10-CM | POA: Diagnosis not present

## 2018-08-21 DIAGNOSIS — I4719 Other supraventricular tachycardia: Secondary | ICD-10-CM

## 2018-08-21 HISTORY — DX: Supraventricular tachycardia: I47.1

## 2018-08-21 HISTORY — DX: Tachycardia, unspecified: R00.0

## 2018-08-21 HISTORY — DX: Other supraventricular tachycardia: I47.19

## 2018-08-21 HISTORY — DX: Localized edema: R60.0

## 2018-08-21 NOTE — Progress Notes (Signed)
Subjective:  Primary Physician:  Elberta Fortis, MD  Patient ID: Wayne Walker, male    DOB: [birth year], 78 y.o.   MRN: [id number]  Chief Complaint  Patient presents with  . Tachycardia  . Follow-up    HPI: Wayne Walker  is a 78 y.o. male  with  hyperlipidemia, hypertension, and controlled type 2 diabetes evaluated by [place] in May 2018 for atypical chest pain and on treadmill stress testing was found to have atrial tachycardia. He is now on atenolol and presents for 1 year follow up.  Patient reports that he is doing well without any complaints today. Palpitations have essentially resolved. Denies any chest pain, PND, orthopnea, syncope, or symptoms of claudication or TIA. He does notice some slight leg edema on occasion.  mer tobacco user that quit 20 years ago with 15 pack year history.  Past Medical History:  Diagnosis Date  . Atrial tachycardia ([place]) 08/21/2018  . Diabetes mellitus without complication ([place])   . Hypertension   . Leg edema 08/21/2018  . seasonal   . Sinus tachycardia 08/21/2018    History reviewed. No pertinent surgical history.  Social History   Socioeconomic History  . Marital status: Married    Spouse name: Not on file  . Number of children: 2  . Years of education: Not on file  . Highest education level: Not on file  Occupational History  . Not on file  Social Needs  . Financial resource strain: Not on file  . Food insecurity:    Worry: Not on file    Inability: Not on file  . Transportation needs:    Medical: Not on file    Non-medical: Not on file  Tobacco Use  . Smoking status: Never Smoker  . Smokeless tobacco: Never Used  Substance and Sexual Activity  . Alcohol use: No  . Drug use: No  . Sexual activity: Not Currently    Comment: married  Lifestyle  . Physical activity:    Days per week: Not on file    Minutes per session: Not on file  . Stress: Not on file  Relationships  . Social connections:    Talks on phone: Not on  file    Gets together: Not on file    Attends religious service: Not on file    Active member of club or organization: Not on file    Attends meetings of clubs or organizations: Not on file    Relationship status: Not on file  . Intimate partner violence:    Fear of current or ex partner: Not on file    Emotionally abused: Not on file    Physically abused: Not on file    Forced sexual activity: Not on file  Other Topics Concern  . Not on file  Social History Narrative  . Not on file    Current Outpatient Medications on File Prior to Visit  Medication Sig Dispense Refill  . atenolol (TENORMIN) 50 MG tablet     . clotrimazole-betamethasone (LOTRISONE) cream     . glimepiride (AMARYL) 4 MG tablet Take 4 mg by mouth daily.    Marland Kitchen lisinopril-hydrochlorothiazide (PRINZIDE,ZESTORETIC) 20-12.5 MG tablet Take 1 tablet by mouth daily.    Marland Kitchen LUMIGAN 0.01 % SOLN Place 1 drop into both eyes 2 (two) times a week.     Marland Kitchen aspirin 81 MG chewable tablet Chew 1 tablet (81 mg total) by mouth daily. (Patient not taking: Reported on 08/21/2018) 30 tablet 0  .  atorvastatin (LIPITOR) 40 MG tablet Take 1 tablet (40 mg total) by mouth daily at 6 PM. (Patient not taking: Reported on 08/21/2018) 30 tablet 0  . Fexofenadine HCl (ALLEGRA PO) Take 1 tablet by mouth daily as needed (for allergies).     No current facility-administered medications on file prior to visit.     Review of Systems  Constitutional: Negative for malaise/fatigue and weight loss.  Respiratory: Negative for cough, hemoptysis and shortness of breath.   Cardiovascular: Negative for chest pain, palpitations, claudication and leg swelling.  Gastrointestinal: Negative for abdominal pain, blood in stool, constipation, heartburn and vomiting.  Genitourinary: Negative for dysuria.  Musculoskeletal: Negative for joint pain and myalgias.  Neurological: Negative for dizziness, focal weakness and headaches.  Endo/Heme/Allergies: Does not bruise/bleed  easily.  Psychiatric/Behavioral: Negative for depression. The patient is not nervous/anxious.   All other systems reviewed and are negative.      Objective:  Blood pressure 130/79, pulse 96, height '5\' 5"'$  (1.651 m), weight 136 lb 9.6 oz (62 kg), SpO2 97 %. Body mass index is 22.73 kg/m.  Physical Exam  Constitutional: He appears well-developed and well-nourished. No distress.  HENT:  Head: Atraumatic.  Eyes: Conjunctivae are normal.  Neck: Neck supple. No JVD present. No thyromegaly present.  Cardiovascular: Normal rate, regular rhythm, normal heart sounds and intact distal pulses. Exam reveals no gallop.  No murmur heard. Pulmonary/Chest: Effort normal and breath sounds normal.  Abdominal: Soft. Bowel sounds are normal.  Musculoskeletal: Normal range of motion.        General: No edema.  Neurological: He is alert.  Skin: Skin is warm and dry.  Psychiatric: He has a normal mood and affect.    CARDIAC STUDIES:   Echocardiogram [02/08/2017]: Left ventricle cavity is normal in size. Normal global wall motion. Normal diastolic filling pattern. Calculated EF 57%. Mild tricuspid regurgitation. Mild pulmonary hypertension. Pulmonary artery systolic pressure is estimated at 33 mm Hg. Mild pulmonic regurgitation.  Treadmill stress test [12/06/2016]:  Indication: Chest pain Resting EKG demonstrates S. Tachycardia @ 140/min. The patient exercised according to [facility] Protocol, Total time recorded 04:11 min achieving max heart rate of 179 which was 124% of THR for age and 6.03 METS of work. Stress terminated due to THR (>85% MPHR)/MPHR met. Normal BP response. There was no ST-T changes of ischemia with exercise stress test. There were no significant arrhythmias. Initially felt to have SVT, but no change in Valsalve, sinus massage slowed down HR with clear sinus activity. Metoprolol 5 mg administered to lower HR to 108/min. Normal BP response. Rec: No e/o ischemia by GXT. Inappropriate sinus  tachycardia vs atrial tachycardia at low rest and also with esxercise. Exercise tolerence is low normal . Assessment & Recommendations:   1. Atrial tachycardia ([place]) - EKG 12-Lead EKG 08/21/2018: Normal sinus rhythm at rate of 90 bpm, normal axis.  No evidence of ischemia, otherwise normal EKG.  2. Hyperglycemia  3. Hypercholeterolemia  4. Laboratory examination  10/17/2016: CBC normal. Creatinine 0.95, potassium 3.9, BMP normal. Troponin 2 negative. Hemoglobin A1c 6.6%. TSH 0.5. Cholesterol 102, triglycerides 35, HDL 51, LDL 44. BNP 10.6.   Recommendation:  Patient is here on annual visit and follow-up of atrial tachycardia noted on treadmill exercise stress test, he has not had any recurrence, essentially remains asymptomatic.  Blood pressure is controlled, previously he was on atorvastatin but he has discontinued this stating that his lipids are normal.  He does have hyperglycemia but he is not a diabetic.  In  view of the fact that he is completely asymptomatic, physical examination is normal I'll see him back on a p.r.n. basis. His EKG reviewed today reveals again normal sinus rhythm.  Adrian Prows, MD, [facility] 08/21/2018, 9:03 AM [facility]. [place] Pager: [phone] Office: [phone] If no answer Cell [phone]

## 2018-09-25 DIAGNOSIS — E7801 Familial hypercholesterolemia: Secondary | ICD-10-CM | POA: Diagnosis not present

## 2018-09-25 DIAGNOSIS — E1169 Type 2 diabetes mellitus with other specified complication: Secondary | ICD-10-CM | POA: Diagnosis not present

## 2018-09-25 DIAGNOSIS — I119 Hypertensive heart disease without heart failure: Secondary | ICD-10-CM | POA: Diagnosis not present

## 2018-12-15 DIAGNOSIS — H2513 Age-related nuclear cataract, bilateral: Secondary | ICD-10-CM | POA: Diagnosis not present

## 2018-12-15 DIAGNOSIS — H5703 Miosis: Secondary | ICD-10-CM | POA: Diagnosis not present

## 2018-12-15 DIAGNOSIS — H401411 Capsular glaucoma with pseudoexfoliation of lens, right eye, mild stage: Secondary | ICD-10-CM | POA: Diagnosis not present

## 2018-12-15 DIAGNOSIS — H401131 Primary open-angle glaucoma, bilateral, mild stage: Secondary | ICD-10-CM | POA: Diagnosis not present

## 2018-12-15 DIAGNOSIS — E119 Type 2 diabetes mellitus without complications: Secondary | ICD-10-CM | POA: Diagnosis not present

## 2019-01-12 DIAGNOSIS — H401411 Capsular glaucoma with pseudoexfoliation of lens, right eye, mild stage: Secondary | ICD-10-CM | POA: Diagnosis not present

## 2019-01-12 DIAGNOSIS — H2513 Age-related nuclear cataract, bilateral: Secondary | ICD-10-CM | POA: Diagnosis not present

## 2019-01-12 DIAGNOSIS — H401132 Primary open-angle glaucoma, bilateral, moderate stage: Secondary | ICD-10-CM | POA: Diagnosis not present

## 2019-01-12 DIAGNOSIS — E119 Type 2 diabetes mellitus without complications: Secondary | ICD-10-CM | POA: Diagnosis not present

## 2019-01-25 DIAGNOSIS — E785 Hyperlipidemia, unspecified: Secondary | ICD-10-CM | POA: Diagnosis not present

## 2019-01-25 DIAGNOSIS — E1169 Type 2 diabetes mellitus with other specified complication: Secondary | ICD-10-CM | POA: Diagnosis not present

## 2019-01-25 DIAGNOSIS — H409 Unspecified glaucoma: Secondary | ICD-10-CM | POA: Diagnosis not present

## 2019-01-25 DIAGNOSIS — I519 Heart disease, unspecified: Secondary | ICD-10-CM | POA: Diagnosis not present

## 2019-01-25 DIAGNOSIS — E7801 Familial hypercholesterolemia: Secondary | ICD-10-CM | POA: Diagnosis not present

## 2019-01-25 DIAGNOSIS — I10 Essential (primary) hypertension: Secondary | ICD-10-CM | POA: Diagnosis not present

## 2019-02-23 DIAGNOSIS — H2513 Age-related nuclear cataract, bilateral: Secondary | ICD-10-CM | POA: Diagnosis not present

## 2019-02-23 DIAGNOSIS — H401411 Capsular glaucoma with pseudoexfoliation of lens, right eye, mild stage: Secondary | ICD-10-CM | POA: Diagnosis not present

## 2019-02-23 DIAGNOSIS — H401132 Primary open-angle glaucoma, bilateral, moderate stage: Secondary | ICD-10-CM | POA: Diagnosis not present

## 2019-05-02 ENCOUNTER — Other Ambulatory Visit: Payer: Self-pay

## 2019-07-13 DIAGNOSIS — H2513 Age-related nuclear cataract, bilateral: Secondary | ICD-10-CM | POA: Diagnosis not present

## 2019-07-13 DIAGNOSIS — H401132 Primary open-angle glaucoma, bilateral, moderate stage: Secondary | ICD-10-CM | POA: Diagnosis not present

## 2019-07-13 DIAGNOSIS — H401411 Capsular glaucoma with pseudoexfoliation of lens, right eye, mild stage: Secondary | ICD-10-CM | POA: Diagnosis not present

## 2019-07-13 DIAGNOSIS — H5703 Miosis: Secondary | ICD-10-CM | POA: Diagnosis not present

## 2019-07-19 DIAGNOSIS — H401122 Primary open-angle glaucoma, left eye, moderate stage: Secondary | ICD-10-CM | POA: Diagnosis not present

## 2019-07-19 DIAGNOSIS — H2512 Age-related nuclear cataract, left eye: Secondary | ICD-10-CM | POA: Diagnosis not present

## 2019-08-11 DIAGNOSIS — H2511 Age-related nuclear cataract, right eye: Secondary | ICD-10-CM | POA: Diagnosis not present

## 2019-08-16 DIAGNOSIS — H401112 Primary open-angle glaucoma, right eye, moderate stage: Secondary | ICD-10-CM | POA: Diagnosis not present

## 2019-08-16 DIAGNOSIS — H2511 Age-related nuclear cataract, right eye: Secondary | ICD-10-CM | POA: Diagnosis not present

## 2019-10-09 DIAGNOSIS — I739 Peripheral vascular disease, unspecified: Secondary | ICD-10-CM | POA: Diagnosis not present

## 2019-10-10 ENCOUNTER — Other Ambulatory Visit (HOSPITAL_COMMUNITY): Payer: Self-pay | Admitting: Family Medicine

## 2019-10-10 ENCOUNTER — Other Ambulatory Visit (HOSPITAL_COMMUNITY): Payer: Self-pay | Admitting: Pediatrics

## 2019-10-10 DIAGNOSIS — I739 Peripheral vascular disease, unspecified: Secondary | ICD-10-CM

## 2019-10-12 ENCOUNTER — Other Ambulatory Visit: Payer: Self-pay

## 2019-10-12 ENCOUNTER — Ambulatory Visit (HOSPITAL_COMMUNITY)
Admission: RE | Admit: 2019-10-12 | Discharge: 2019-10-12 | Disposition: A | Discharge status: Home / Self Care | Payer: Medicare Other | Source: Ambulatory Visit | Attending: Family Medicine | Admitting: Family Medicine

## 2019-10-12 DIAGNOSIS — I739 Peripheral vascular disease, unspecified: Secondary | ICD-10-CM | POA: Diagnosis not present

## 2019-10-12 NOTE — Progress Notes (Signed)
ABI completed. Refer to "CV Proc" under chart review to view preliminary results.  10/12/2019 1:29 PM Eula Fried., MHA, RVT, RDCS, RDMS

## 2019-10-23 DIAGNOSIS — I10 Essential (primary) hypertension: Secondary | ICD-10-CM | POA: Diagnosis not present

## 2019-10-23 DIAGNOSIS — I739 Peripheral vascular disease, unspecified: Secondary | ICD-10-CM | POA: Diagnosis not present

## 2019-10-23 DIAGNOSIS — I519 Heart disease, unspecified: Secondary | ICD-10-CM | POA: Diagnosis not present

## 2019-10-23 DIAGNOSIS — E785 Hyperlipidemia, unspecified: Secondary | ICD-10-CM | POA: Diagnosis not present

## 2019-12-05 DIAGNOSIS — I270 Primary pulmonary hypertension: Secondary | ICD-10-CM | POA: Diagnosis not present

## 2019-12-05 DIAGNOSIS — E1101 Type 2 diabetes mellitus with hyperosmolarity with coma: Secondary | ICD-10-CM | POA: Diagnosis not present

## 2019-12-05 DIAGNOSIS — E1165 Type 2 diabetes mellitus with hyperglycemia: Secondary | ICD-10-CM | POA: Diagnosis not present

## 2019-12-18 DIAGNOSIS — H401132 Primary open-angle glaucoma, bilateral, moderate stage: Secondary | ICD-10-CM | POA: Diagnosis not present

## 2019-12-18 DIAGNOSIS — Z961 Presence of intraocular lens: Secondary | ICD-10-CM | POA: Diagnosis not present

## 2019-12-18 DIAGNOSIS — H401411 Capsular glaucoma with pseudoexfoliation of lens, right eye, mild stage: Secondary | ICD-10-CM | POA: Diagnosis not present

## 2019-12-18 DIAGNOSIS — H5703 Miosis: Secondary | ICD-10-CM | POA: Diagnosis not present

## 2019-12-18 DIAGNOSIS — E119 Type 2 diabetes mellitus without complications: Secondary | ICD-10-CM | POA: Diagnosis not present

## 2019-12-25 DIAGNOSIS — I119 Hypertensive heart disease without heart failure: Secondary | ICD-10-CM | POA: Diagnosis not present

## 2019-12-25 DIAGNOSIS — E1169 Type 2 diabetes mellitus with other specified complication: Secondary | ICD-10-CM | POA: Diagnosis not present

## 2019-12-25 DIAGNOSIS — Z6820 Body mass index (BMI) 20.0-20.9, adult: Secondary | ICD-10-CM | POA: Diagnosis not present

## 2020-03-25 DIAGNOSIS — E1165 Type 2 diabetes mellitus with hyperglycemia: Secondary | ICD-10-CM | POA: Diagnosis not present

## 2020-03-25 DIAGNOSIS — E785 Hyperlipidemia, unspecified: Secondary | ICD-10-CM | POA: Diagnosis not present

## 2020-03-25 DIAGNOSIS — I519 Heart disease, unspecified: Secondary | ICD-10-CM | POA: Diagnosis not present

## 2020-03-25 DIAGNOSIS — I10 Essential (primary) hypertension: Secondary | ICD-10-CM | POA: Diagnosis not present

## 2020-04-16 DIAGNOSIS — H401411 Capsular glaucoma with pseudoexfoliation of lens, right eye, mild stage: Secondary | ICD-10-CM | POA: Diagnosis not present

## 2020-04-16 DIAGNOSIS — E119 Type 2 diabetes mellitus without complications: Secondary | ICD-10-CM | POA: Diagnosis not present

## 2020-04-16 DIAGNOSIS — Z961 Presence of intraocular lens: Secondary | ICD-10-CM | POA: Diagnosis not present

## 2020-04-16 DIAGNOSIS — H5703 Miosis: Secondary | ICD-10-CM | POA: Diagnosis not present

## 2020-04-16 DIAGNOSIS — H401132 Primary open-angle glaucoma, bilateral, moderate stage: Secondary | ICD-10-CM | POA: Diagnosis not present

## 2020-07-25 DIAGNOSIS — Z72821 Inadequate sleep hygiene: Secondary | ICD-10-CM | POA: Diagnosis not present

## 2020-07-25 DIAGNOSIS — I10 Essential (primary) hypertension: Secondary | ICD-10-CM | POA: Diagnosis not present

## 2020-07-25 DIAGNOSIS — E1169 Type 2 diabetes mellitus with other specified complication: Secondary | ICD-10-CM | POA: Diagnosis not present

## 2020-07-25 DIAGNOSIS — I739 Peripheral vascular disease, unspecified: Secondary | ICD-10-CM | POA: Diagnosis not present

## 2020-07-25 DIAGNOSIS — E785 Hyperlipidemia, unspecified: Secondary | ICD-10-CM | POA: Diagnosis not present

## 2020-07-25 DIAGNOSIS — I519 Heart disease, unspecified: Secondary | ICD-10-CM | POA: Diagnosis not present

## 2020-08-19 DIAGNOSIS — H5703 Miosis: Secondary | ICD-10-CM | POA: Diagnosis not present

## 2020-08-19 DIAGNOSIS — H401132 Primary open-angle glaucoma, bilateral, moderate stage: Secondary | ICD-10-CM | POA: Diagnosis not present

## 2020-08-19 DIAGNOSIS — H401411 Capsular glaucoma with pseudoexfoliation of lens, right eye, mild stage: Secondary | ICD-10-CM | POA: Diagnosis not present

## 2020-08-19 DIAGNOSIS — E119 Type 2 diabetes mellitus without complications: Secondary | ICD-10-CM | POA: Diagnosis not present

## 2020-08-19 DIAGNOSIS — Z961 Presence of intraocular lens: Secondary | ICD-10-CM | POA: Diagnosis not present

## 2020-09-04 DIAGNOSIS — E1101 Type 2 diabetes mellitus with hyperosmolarity with coma: Secondary | ICD-10-CM | POA: Diagnosis not present

## 2020-09-04 DIAGNOSIS — E1165 Type 2 diabetes mellitus with hyperglycemia: Secondary | ICD-10-CM | POA: Diagnosis not present

## 2020-09-04 DIAGNOSIS — I270 Primary pulmonary hypertension: Secondary | ICD-10-CM | POA: Diagnosis not present

## 2020-09-11 DIAGNOSIS — E1169 Type 2 diabetes mellitus with other specified complication: Secondary | ICD-10-CM | POA: Diagnosis not present

## 2020-09-11 DIAGNOSIS — I119 Hypertensive heart disease without heart failure: Secondary | ICD-10-CM | POA: Diagnosis not present

## 2020-09-11 DIAGNOSIS — I270 Primary pulmonary hypertension: Secondary | ICD-10-CM | POA: Diagnosis not present

## 2020-09-11 DIAGNOSIS — E785 Hyperlipidemia, unspecified: Secondary | ICD-10-CM | POA: Diagnosis not present

## 2020-09-11 DIAGNOSIS — Z0000 Encounter for general adult medical examination without abnormal findings: Secondary | ICD-10-CM | POA: Diagnosis not present

## 2020-09-11 DIAGNOSIS — I739 Peripheral vascular disease, unspecified: Secondary | ICD-10-CM | POA: Diagnosis not present

## 2020-11-21 DIAGNOSIS — E785 Hyperlipidemia, unspecified: Secondary | ICD-10-CM | POA: Diagnosis not present

## 2020-11-21 DIAGNOSIS — Z72821 Inadequate sleep hygiene: Secondary | ICD-10-CM | POA: Diagnosis not present

## 2020-11-21 DIAGNOSIS — E1169 Type 2 diabetes mellitus with other specified complication: Secondary | ICD-10-CM | POA: Diagnosis not present

## 2020-12-17 DIAGNOSIS — H5703 Miosis: Secondary | ICD-10-CM | POA: Diagnosis not present

## 2020-12-17 DIAGNOSIS — E119 Type 2 diabetes mellitus without complications: Secondary | ICD-10-CM | POA: Diagnosis not present

## 2020-12-17 DIAGNOSIS — Z961 Presence of intraocular lens: Secondary | ICD-10-CM | POA: Diagnosis not present

## 2020-12-17 DIAGNOSIS — H401132 Primary open-angle glaucoma, bilateral, moderate stage: Secondary | ICD-10-CM | POA: Diagnosis not present

## 2020-12-17 DIAGNOSIS — H401411 Capsular glaucoma with pseudoexfoliation of lens, right eye, mild stage: Secondary | ICD-10-CM | POA: Diagnosis not present

## 2021-02-04 DIAGNOSIS — E1101 Type 2 diabetes mellitus with hyperosmolarity with coma: Secondary | ICD-10-CM | POA: Diagnosis not present

## 2021-02-04 DIAGNOSIS — I270 Primary pulmonary hypertension: Secondary | ICD-10-CM | POA: Diagnosis not present

## 2021-02-04 DIAGNOSIS — E1165 Type 2 diabetes mellitus with hyperglycemia: Secondary | ICD-10-CM | POA: Diagnosis not present

## 2021-03-24 DIAGNOSIS — E1165 Type 2 diabetes mellitus with hyperglycemia: Secondary | ICD-10-CM | POA: Diagnosis not present

## 2021-03-24 DIAGNOSIS — I10 Essential (primary) hypertension: Secondary | ICD-10-CM | POA: Diagnosis not present

## 2021-04-06 DIAGNOSIS — E1101 Type 2 diabetes mellitus with hyperosmolarity with coma: Secondary | ICD-10-CM | POA: Diagnosis not present

## 2021-04-06 DIAGNOSIS — E1165 Type 2 diabetes mellitus with hyperglycemia: Secondary | ICD-10-CM | POA: Diagnosis not present

## 2021-04-06 DIAGNOSIS — I270 Primary pulmonary hypertension: Secondary | ICD-10-CM | POA: Diagnosis not present

## 2021-04-15 DIAGNOSIS — H5703 Miosis: Secondary | ICD-10-CM | POA: Diagnosis not present

## 2021-04-15 DIAGNOSIS — H401132 Primary open-angle glaucoma, bilateral, moderate stage: Secondary | ICD-10-CM | POA: Diagnosis not present

## 2021-04-15 DIAGNOSIS — Z961 Presence of intraocular lens: Secondary | ICD-10-CM | POA: Diagnosis not present

## 2021-04-15 DIAGNOSIS — H401411 Capsular glaucoma with pseudoexfoliation of lens, right eye, mild stage: Secondary | ICD-10-CM | POA: Diagnosis not present

## 2021-04-15 DIAGNOSIS — E119 Type 2 diabetes mellitus without complications: Secondary | ICD-10-CM | POA: Diagnosis not present

## 2021-05-06 DIAGNOSIS — E1101 Type 2 diabetes mellitus with hyperosmolarity with coma: Secondary | ICD-10-CM | POA: Diagnosis not present

## 2021-05-06 DIAGNOSIS — I270 Primary pulmonary hypertension: Secondary | ICD-10-CM | POA: Diagnosis not present

## 2021-05-06 DIAGNOSIS — E1165 Type 2 diabetes mellitus with hyperglycemia: Secondary | ICD-10-CM | POA: Diagnosis not present

## 2021-07-21 DIAGNOSIS — R634 Abnormal weight loss: Secondary | ICD-10-CM | POA: Diagnosis not present

## 2021-07-21 DIAGNOSIS — R1310 Dysphagia, unspecified: Secondary | ICD-10-CM | POA: Diagnosis not present

## 2021-07-21 DIAGNOSIS — J399 Disease of upper respiratory tract, unspecified: Secondary | ICD-10-CM | POA: Diagnosis not present

## 2021-07-21 DIAGNOSIS — I119 Hypertensive heart disease without heart failure: Secondary | ICD-10-CM | POA: Diagnosis not present

## 2021-07-21 DIAGNOSIS — E1169 Type 2 diabetes mellitus with other specified complication: Secondary | ICD-10-CM | POA: Diagnosis not present

## 2021-07-28 DIAGNOSIS — I119 Hypertensive heart disease without heart failure: Secondary | ICD-10-CM | POA: Diagnosis not present

## 2021-07-28 DIAGNOSIS — E785 Hyperlipidemia, unspecified: Secondary | ICD-10-CM | POA: Diagnosis not present

## 2021-07-28 DIAGNOSIS — R9431 Abnormal electrocardiogram [ECG] [EKG]: Secondary | ICD-10-CM | POA: Diagnosis not present

## 2021-07-28 DIAGNOSIS — I10 Essential (primary) hypertension: Secondary | ICD-10-CM | POA: Diagnosis not present

## 2021-07-28 DIAGNOSIS — E1169 Type 2 diabetes mellitus with other specified complication: Secondary | ICD-10-CM | POA: Diagnosis not present

## 2021-07-28 DIAGNOSIS — R634 Abnormal weight loss: Secondary | ICD-10-CM | POA: Diagnosis not present

## 2021-08-19 DIAGNOSIS — E119 Type 2 diabetes mellitus without complications: Secondary | ICD-10-CM | POA: Diagnosis not present

## 2021-08-19 DIAGNOSIS — H401411 Capsular glaucoma with pseudoexfoliation of lens, right eye, mild stage: Secondary | ICD-10-CM | POA: Diagnosis not present

## 2021-08-19 DIAGNOSIS — H401132 Primary open-angle glaucoma, bilateral, moderate stage: Secondary | ICD-10-CM | POA: Diagnosis not present

## 2021-08-19 DIAGNOSIS — Z961 Presence of intraocular lens: Secondary | ICD-10-CM | POA: Diagnosis not present

## 2021-08-19 DIAGNOSIS — H5703 Miosis: Secondary | ICD-10-CM | POA: Diagnosis not present

## 2021-08-28 DIAGNOSIS — E785 Hyperlipidemia, unspecified: Secondary | ICD-10-CM | POA: Diagnosis not present

## 2021-08-28 DIAGNOSIS — E1169 Type 2 diabetes mellitus with other specified complication: Secondary | ICD-10-CM | POA: Diagnosis not present

## 2021-08-28 DIAGNOSIS — I10 Essential (primary) hypertension: Secondary | ICD-10-CM | POA: Diagnosis not present

## 2021-08-28 DIAGNOSIS — I519 Heart disease, unspecified: Secondary | ICD-10-CM | POA: Diagnosis not present

## 2021-10-09 DIAGNOSIS — R600 Localized edema: Secondary | ICD-10-CM | POA: Diagnosis not present

## 2021-10-09 DIAGNOSIS — I519 Heart disease, unspecified: Secondary | ICD-10-CM | POA: Diagnosis not present

## 2021-10-09 DIAGNOSIS — E785 Hyperlipidemia, unspecified: Secondary | ICD-10-CM | POA: Diagnosis not present

## 2021-10-09 DIAGNOSIS — E1169 Type 2 diabetes mellitus with other specified complication: Secondary | ICD-10-CM | POA: Diagnosis not present

## 2021-10-09 DIAGNOSIS — Z0000 Encounter for general adult medical examination without abnormal findings: Secondary | ICD-10-CM | POA: Diagnosis not present

## 2021-11-20 DIAGNOSIS — R1319 Other dysphagia: Secondary | ICD-10-CM | POA: Diagnosis not present

## 2021-11-20 DIAGNOSIS — I119 Hypertensive heart disease without heart failure: Secondary | ICD-10-CM | POA: Diagnosis not present

## 2021-11-20 DIAGNOSIS — Z72821 Inadequate sleep hygiene: Secondary | ICD-10-CM | POA: Diagnosis not present

## 2021-11-20 DIAGNOSIS — I10 Essential (primary) hypertension: Secondary | ICD-10-CM | POA: Diagnosis not present

## 2021-11-20 DIAGNOSIS — E1169 Type 2 diabetes mellitus with other specified complication: Secondary | ICD-10-CM | POA: Diagnosis not present

## 2021-11-23 ENCOUNTER — Other Ambulatory Visit: Payer: Self-pay | Admitting: Family Medicine

## 2021-11-23 DIAGNOSIS — R1319 Other dysphagia: Secondary | ICD-10-CM

## 2021-12-11 DIAGNOSIS — R1319 Other dysphagia: Secondary | ICD-10-CM | POA: Diagnosis not present

## 2021-12-16 ENCOUNTER — Ambulatory Visit
Admission: RE | Admit: 2021-12-16 | Discharge: 2021-12-16 | Disposition: A | Discharge status: Home / Self Care | Payer: Medicare Other | Source: Ambulatory Visit | Attending: Family Medicine | Admitting: Family Medicine

## 2021-12-16 DIAGNOSIS — R1310 Dysphagia, unspecified: Secondary | ICD-10-CM | POA: Diagnosis not present

## 2021-12-16 DIAGNOSIS — R1319 Other dysphagia: Secondary | ICD-10-CM

## 2021-12-21 DIAGNOSIS — H5703 Miosis: Secondary | ICD-10-CM | POA: Diagnosis not present

## 2021-12-21 DIAGNOSIS — H401132 Primary open-angle glaucoma, bilateral, moderate stage: Secondary | ICD-10-CM | POA: Diagnosis not present

## 2021-12-21 DIAGNOSIS — Z961 Presence of intraocular lens: Secondary | ICD-10-CM | POA: Diagnosis not present

## 2021-12-21 DIAGNOSIS — H401411 Capsular glaucoma with pseudoexfoliation of lens, right eye, mild stage: Secondary | ICD-10-CM | POA: Diagnosis not present

## 2021-12-21 DIAGNOSIS — E119 Type 2 diabetes mellitus without complications: Secondary | ICD-10-CM | POA: Diagnosis not present

## 2021-12-23 ENCOUNTER — Other Ambulatory Visit (HOSPITAL_COMMUNITY): Payer: Self-pay

## 2021-12-23 DIAGNOSIS — R1310 Dysphagia, unspecified: Secondary | ICD-10-CM

## 2021-12-23 DIAGNOSIS — R059 Cough, unspecified: Secondary | ICD-10-CM

## 2021-12-31 ENCOUNTER — Ambulatory Visit (HOSPITAL_COMMUNITY)
Admission: RE | Admit: 2021-12-31 | Discharge: 2021-12-31 | Disposition: A | Discharge status: Home / Self Care | Payer: Medicare Other | Source: Ambulatory Visit | Attending: Family Medicine | Admitting: Family Medicine

## 2021-12-31 DIAGNOSIS — R059 Cough, unspecified: Secondary | ICD-10-CM | POA: Diagnosis not present

## 2021-12-31 DIAGNOSIS — R1310 Dysphagia, unspecified: Secondary | ICD-10-CM | POA: Diagnosis not present

## 2021-12-31 NOTE — Progress Notes (Signed)
Modified Barium Swallow Progress Note  Patient Details  Name: Wayne Walker MRN: [id number] Date of Birth: [birth year]  Today's Date: 12/31/2021  Modified Barium Swallow completed.  Full report located under Chart Review in the Imaging Section.  Brief recommendations include the following:  Clinical Impression  Pt demonstrates mild age related changes in swallowing. On initial sip, pt initiated swallow as bolus arrived to pyriforms. In most other trials pt demonstrated contientious swallowing behavior with early protective ariway closure during brief oral hold. LIkely, this is a self-learned compensatory strategy to prevent any coughing or aspiration before the swallow resulting from age related swallowing changes. In short, pt is protecting his airway well and swallowing WFL. Esophageal sweep relatively unrevealing except that barium tablet did not pass the GE junction. SLP aprovided some basic compensatory strategies to prevent night time reflux given his report.   Swallow Evaluation Recommendations       SLP Diet Recommendations: Regular solids;Thin liquid   Liquid Administration via: Cup;Straw   Medication Administration: Whole meds with liquid   Supervision: Patient able to self feed   Compensations: Slow rate;Small sips/bites   Postural Changes: Remain semi-upright after after feeds/meals (Comment);Seated upright at 90 degrees            Wayne Walker, Riley Nearing 12/31/2021,2:24 PM

## 2022-01-26 DIAGNOSIS — E1169 Type 2 diabetes mellitus with other specified complication: Secondary | ICD-10-CM | POA: Diagnosis not present

## 2022-01-26 DIAGNOSIS — R1319 Other dysphagia: Secondary | ICD-10-CM | POA: Diagnosis not present

## 2022-01-26 DIAGNOSIS — E785 Hyperlipidemia, unspecified: Secondary | ICD-10-CM | POA: Diagnosis not present

## 2022-01-26 DIAGNOSIS — I10 Essential (primary) hypertension: Secondary | ICD-10-CM | POA: Diagnosis not present

## 2022-04-21 DIAGNOSIS — H401411 Capsular glaucoma with pseudoexfoliation of lens, right eye, mild stage: Secondary | ICD-10-CM | POA: Diagnosis not present

## 2022-04-21 DIAGNOSIS — Z961 Presence of intraocular lens: Secondary | ICD-10-CM | POA: Diagnosis not present

## 2022-04-21 DIAGNOSIS — H401132 Primary open-angle glaucoma, bilateral, moderate stage: Secondary | ICD-10-CM | POA: Diagnosis not present

## 2022-04-21 DIAGNOSIS — H5703 Miosis: Secondary | ICD-10-CM | POA: Diagnosis not present

## 2022-04-21 DIAGNOSIS — E119 Type 2 diabetes mellitus without complications: Secondary | ICD-10-CM | POA: Diagnosis not present

## 2022-05-28 DIAGNOSIS — E1169 Type 2 diabetes mellitus with other specified complication: Secondary | ICD-10-CM | POA: Diagnosis not present

## 2022-05-28 DIAGNOSIS — I10 Essential (primary) hypertension: Secondary | ICD-10-CM | POA: Diagnosis not present

## 2022-05-28 DIAGNOSIS — I119 Hypertensive heart disease without heart failure: Secondary | ICD-10-CM | POA: Diagnosis not present

## 2022-05-28 DIAGNOSIS — E785 Hyperlipidemia, unspecified: Secondary | ICD-10-CM | POA: Diagnosis not present

## 2022-05-28 DIAGNOSIS — I739 Peripheral vascular disease, unspecified: Secondary | ICD-10-CM | POA: Diagnosis not present

## 2022-05-28 DIAGNOSIS — E559 Vitamin D deficiency, unspecified: Secondary | ICD-10-CM | POA: Diagnosis not present

## 2022-05-28 DIAGNOSIS — Z6821 Body mass index (BMI) 21.0-21.9, adult: Secondary | ICD-10-CM | POA: Diagnosis not present

## 2022-08-23 DIAGNOSIS — E119 Type 2 diabetes mellitus without complications: Secondary | ICD-10-CM | POA: Diagnosis not present

## 2022-08-23 DIAGNOSIS — H401411 Capsular glaucoma with pseudoexfoliation of lens, right eye, mild stage: Secondary | ICD-10-CM | POA: Diagnosis not present

## 2022-08-23 DIAGNOSIS — Z961 Presence of intraocular lens: Secondary | ICD-10-CM | POA: Diagnosis not present

## 2022-08-23 DIAGNOSIS — H401132 Primary open-angle glaucoma, bilateral, moderate stage: Secondary | ICD-10-CM | POA: Diagnosis not present

## 2022-08-23 DIAGNOSIS — H5703 Miosis: Secondary | ICD-10-CM | POA: Diagnosis not present

## 2022-09-27 ENCOUNTER — Other Ambulatory Visit (HOSPITAL_COMMUNITY): Payer: Self-pay | Admitting: Family Medicine

## 2022-09-27 DIAGNOSIS — E559 Vitamin D deficiency, unspecified: Secondary | ICD-10-CM | POA: Diagnosis not present

## 2022-09-27 DIAGNOSIS — R634 Abnormal weight loss: Secondary | ICD-10-CM | POA: Diagnosis not present

## 2022-09-27 DIAGNOSIS — E1169 Type 2 diabetes mellitus with other specified complication: Secondary | ICD-10-CM | POA: Diagnosis not present

## 2022-09-27 DIAGNOSIS — R64 Cachexia: Secondary | ICD-10-CM | POA: Diagnosis not present

## 2022-09-27 DIAGNOSIS — E785 Hyperlipidemia, unspecified: Secondary | ICD-10-CM | POA: Diagnosis not present

## 2022-09-27 DIAGNOSIS — Z72821 Inadequate sleep hygiene: Secondary | ICD-10-CM | POA: Diagnosis not present

## 2022-09-27 DIAGNOSIS — R1310 Dysphagia, unspecified: Secondary | ICD-10-CM | POA: Diagnosis not present

## 2022-09-27 DIAGNOSIS — Z6821 Body mass index (BMI) 21.0-21.9, adult: Secondary | ICD-10-CM | POA: Diagnosis not present

## 2022-09-27 DIAGNOSIS — E7800 Pure hypercholesterolemia, unspecified: Secondary | ICD-10-CM | POA: Diagnosis not present

## 2022-09-27 DIAGNOSIS — I10 Essential (primary) hypertension: Secondary | ICD-10-CM | POA: Diagnosis not present

## 2022-10-05 ENCOUNTER — Ambulatory Visit (HOSPITAL_COMMUNITY)
Admission: RE | Admit: 2022-10-05 | Discharge: 2022-10-05 | Disposition: A | Discharge status: Home / Self Care | Payer: Medicare Other | Source: Ambulatory Visit | Attending: Family Medicine | Admitting: Family Medicine

## 2022-10-05 DIAGNOSIS — R1310 Dysphagia, unspecified: Secondary | ICD-10-CM | POA: Diagnosis not present

## 2022-10-05 DIAGNOSIS — K224 Dyskinesia of esophagus: Secondary | ICD-10-CM | POA: Diagnosis not present

## 2022-10-18 DIAGNOSIS — R634 Abnormal weight loss: Secondary | ICD-10-CM | POA: Diagnosis not present

## 2022-10-18 DIAGNOSIS — K224 Dyskinesia of esophagus: Secondary | ICD-10-CM | POA: Diagnosis not present

## 2022-10-18 DIAGNOSIS — I10 Essential (primary) hypertension: Secondary | ICD-10-CM | POA: Diagnosis not present

## 2022-11-29 DIAGNOSIS — E785 Hyperlipidemia, unspecified: Secondary | ICD-10-CM | POA: Diagnosis not present

## 2022-11-29 DIAGNOSIS — I10 Essential (primary) hypertension: Secondary | ICD-10-CM | POA: Diagnosis not present

## 2022-11-29 DIAGNOSIS — E1169 Type 2 diabetes mellitus with other specified complication: Secondary | ICD-10-CM | POA: Diagnosis not present

## 2022-11-29 DIAGNOSIS — R634 Abnormal weight loss: Secondary | ICD-10-CM | POA: Diagnosis not present

## 2022-12-14 DIAGNOSIS — R000 Tachycardia, unspecified: Secondary | ICD-10-CM | POA: Diagnosis not present

## 2022-12-14 DIAGNOSIS — E1169 Type 2 diabetes mellitus with other specified complication: Secondary | ICD-10-CM | POA: Diagnosis not present

## 2022-12-14 DIAGNOSIS — I10 Essential (primary) hypertension: Secondary | ICD-10-CM | POA: Diagnosis not present

## 2022-12-24 DIAGNOSIS — H5703 Miosis: Secondary | ICD-10-CM | POA: Diagnosis not present

## 2022-12-24 DIAGNOSIS — H401411 Capsular glaucoma with pseudoexfoliation of lens, right eye, mild stage: Secondary | ICD-10-CM | POA: Diagnosis not present

## 2022-12-24 DIAGNOSIS — H401132 Primary open-angle glaucoma, bilateral, moderate stage: Secondary | ICD-10-CM | POA: Diagnosis not present

## 2022-12-24 DIAGNOSIS — E119 Type 2 diabetes mellitus without complications: Secondary | ICD-10-CM | POA: Diagnosis not present

## 2022-12-24 DIAGNOSIS — Z961 Presence of intraocular lens: Secondary | ICD-10-CM | POA: Diagnosis not present

## 2022-12-28 DIAGNOSIS — E1169 Type 2 diabetes mellitus with other specified complication: Secondary | ICD-10-CM | POA: Diagnosis not present

## 2022-12-28 DIAGNOSIS — I519 Heart disease, unspecified: Secondary | ICD-10-CM | POA: Diagnosis not present

## 2022-12-28 DIAGNOSIS — I10 Essential (primary) hypertension: Secondary | ICD-10-CM | POA: Diagnosis not present

## 2022-12-28 DIAGNOSIS — E785 Hyperlipidemia, unspecified: Secondary | ICD-10-CM | POA: Diagnosis not present

## 2023-01-28 DIAGNOSIS — E1169 Type 2 diabetes mellitus with other specified complication: Secondary | ICD-10-CM | POA: Diagnosis not present

## 2023-01-28 DIAGNOSIS — I10 Essential (primary) hypertension: Secondary | ICD-10-CM | POA: Diagnosis not present

## 2023-01-28 DIAGNOSIS — E785 Hyperlipidemia, unspecified: Secondary | ICD-10-CM | POA: Diagnosis not present

## 2023-01-28 DIAGNOSIS — I519 Heart disease, unspecified: Secondary | ICD-10-CM | POA: Diagnosis not present

## 2023-03-11 DIAGNOSIS — Z2821 Immunization not carried out because of patient refusal: Secondary | ICD-10-CM | POA: Diagnosis not present

## 2023-03-11 DIAGNOSIS — Z72821 Inadequate sleep hygiene: Secondary | ICD-10-CM | POA: Diagnosis not present

## 2023-03-11 DIAGNOSIS — E1169 Type 2 diabetes mellitus with other specified complication: Secondary | ICD-10-CM | POA: Diagnosis not present

## 2023-03-11 DIAGNOSIS — I10 Essential (primary) hypertension: Secondary | ICD-10-CM | POA: Diagnosis not present

## 2023-05-12 DIAGNOSIS — I119 Hypertensive heart disease without heart failure: Secondary | ICD-10-CM | POA: Diagnosis not present

## 2023-05-12 DIAGNOSIS — I519 Heart disease, unspecified: Secondary | ICD-10-CM | POA: Diagnosis not present

## 2023-05-12 DIAGNOSIS — I10 Essential (primary) hypertension: Secondary | ICD-10-CM | POA: Diagnosis not present

## 2023-05-12 DIAGNOSIS — Z6821 Body mass index (BMI) 21.0-21.9, adult: Secondary | ICD-10-CM | POA: Diagnosis not present

## 2023-05-12 DIAGNOSIS — E785 Hyperlipidemia, unspecified: Secondary | ICD-10-CM | POA: Diagnosis not present

## 2023-05-12 DIAGNOSIS — E1169 Type 2 diabetes mellitus with other specified complication: Secondary | ICD-10-CM | POA: Diagnosis not present

## 2023-05-25 DIAGNOSIS — H401411 Capsular glaucoma with pseudoexfoliation of lens, right eye, mild stage: Secondary | ICD-10-CM | POA: Diagnosis not present

## 2023-05-25 DIAGNOSIS — Z961 Presence of intraocular lens: Secondary | ICD-10-CM | POA: Diagnosis not present

## 2023-05-25 DIAGNOSIS — H401132 Primary open-angle glaucoma, bilateral, moderate stage: Secondary | ICD-10-CM | POA: Diagnosis not present

## 2023-05-25 DIAGNOSIS — E119 Type 2 diabetes mellitus without complications: Secondary | ICD-10-CM | POA: Diagnosis not present

## 2023-05-25 DIAGNOSIS — H5703 Miosis: Secondary | ICD-10-CM | POA: Diagnosis not present

## 2023-06-23 DIAGNOSIS — I10 Essential (primary) hypertension: Secondary | ICD-10-CM | POA: Diagnosis not present

## 2023-06-23 DIAGNOSIS — E1169 Type 2 diabetes mellitus with other specified complication: Secondary | ICD-10-CM | POA: Diagnosis not present

## 2023-06-23 DIAGNOSIS — R634 Abnormal weight loss: Secondary | ICD-10-CM | POA: Diagnosis not present

## 2023-06-23 DIAGNOSIS — E785 Hyperlipidemia, unspecified: Secondary | ICD-10-CM | POA: Diagnosis not present

## 2023-09-01 DIAGNOSIS — E1169 Type 2 diabetes mellitus with other specified complication: Secondary | ICD-10-CM | POA: Diagnosis not present

## 2023-09-01 DIAGNOSIS — I10 Essential (primary) hypertension: Secondary | ICD-10-CM | POA: Diagnosis not present

## 2023-09-27 DIAGNOSIS — H401132 Primary open-angle glaucoma, bilateral, moderate stage: Secondary | ICD-10-CM | POA: Diagnosis not present

## 2023-09-27 DIAGNOSIS — Z961 Presence of intraocular lens: Secondary | ICD-10-CM | POA: Diagnosis not present

## 2023-09-27 DIAGNOSIS — E119 Type 2 diabetes mellitus without complications: Secondary | ICD-10-CM | POA: Diagnosis not present

## 2023-09-27 DIAGNOSIS — H401411 Capsular glaucoma with pseudoexfoliation of lens, right eye, mild stage: Secondary | ICD-10-CM | POA: Diagnosis not present

## 2023-09-27 DIAGNOSIS — H5703 Miosis: Secondary | ICD-10-CM | POA: Diagnosis not present

## 2023-12-02 DIAGNOSIS — E1169 Type 2 diabetes mellitus with other specified complication: Secondary | ICD-10-CM | POA: Diagnosis not present

## 2023-12-02 DIAGNOSIS — R059 Cough, unspecified: Secondary | ICD-10-CM | POA: Diagnosis not present

## 2023-12-02 DIAGNOSIS — Z6821 Body mass index (BMI) 21.0-21.9, adult: Secondary | ICD-10-CM | POA: Diagnosis not present

## 2023-12-02 DIAGNOSIS — I10 Essential (primary) hypertension: Secondary | ICD-10-CM | POA: Diagnosis not present

## 2023-12-02 DIAGNOSIS — E118 Type 2 diabetes mellitus with unspecified complications: Secondary | ICD-10-CM | POA: Diagnosis not present

## 2023-12-02 DIAGNOSIS — E785 Hyperlipidemia, unspecified: Secondary | ICD-10-CM | POA: Diagnosis not present

## 2023-12-02 DIAGNOSIS — R1312 Dysphagia, oropharyngeal phase: Secondary | ICD-10-CM | POA: Diagnosis not present

## 2023-12-16 DIAGNOSIS — E1169 Type 2 diabetes mellitus with other specified complication: Secondary | ICD-10-CM | POA: Diagnosis not present

## 2023-12-16 DIAGNOSIS — I119 Hypertensive heart disease without heart failure: Secondary | ICD-10-CM | POA: Diagnosis not present

## 2023-12-16 DIAGNOSIS — E785 Hyperlipidemia, unspecified: Secondary | ICD-10-CM | POA: Diagnosis not present

## 2023-12-16 DIAGNOSIS — R000 Tachycardia, unspecified: Secondary | ICD-10-CM | POA: Diagnosis not present

## 2023-12-16 DIAGNOSIS — I519 Heart disease, unspecified: Secondary | ICD-10-CM | POA: Diagnosis not present

## 2024-01-27 DIAGNOSIS — E1169 Type 2 diabetes mellitus with other specified complication: Secondary | ICD-10-CM | POA: Diagnosis not present

## 2024-01-27 DIAGNOSIS — I119 Hypertensive heart disease without heart failure: Secondary | ICD-10-CM | POA: Diagnosis not present

## 2024-01-27 DIAGNOSIS — R000 Tachycardia, unspecified: Secondary | ICD-10-CM | POA: Diagnosis not present

## 2024-02-01 DIAGNOSIS — H401411 Capsular glaucoma with pseudoexfoliation of lens, right eye, mild stage: Secondary | ICD-10-CM | POA: Diagnosis not present

## 2024-02-01 DIAGNOSIS — H401132 Primary open-angle glaucoma, bilateral, moderate stage: Secondary | ICD-10-CM | POA: Diagnosis not present

## 2024-02-01 DIAGNOSIS — E119 Type 2 diabetes mellitus without complications: Secondary | ICD-10-CM | POA: Diagnosis not present

## 2024-02-01 DIAGNOSIS — H5703 Miosis: Secondary | ICD-10-CM | POA: Diagnosis not present

## 2024-02-01 DIAGNOSIS — Z961 Presence of intraocular lens: Secondary | ICD-10-CM | POA: Diagnosis not present

## 2024-03-09 DIAGNOSIS — E785 Hyperlipidemia, unspecified: Secondary | ICD-10-CM | POA: Diagnosis not present

## 2024-03-09 DIAGNOSIS — Z6820 Body mass index (BMI) 20.0-20.9, adult: Secondary | ICD-10-CM | POA: Diagnosis not present

## 2024-03-09 DIAGNOSIS — I10 Essential (primary) hypertension: Secondary | ICD-10-CM | POA: Diagnosis not present

## 2024-03-09 DIAGNOSIS — I519 Heart disease, unspecified: Secondary | ICD-10-CM | POA: Diagnosis not present

## 2024-03-09 DIAGNOSIS — R000 Tachycardia, unspecified: Secondary | ICD-10-CM | POA: Diagnosis not present

## 2024-03-09 DIAGNOSIS — E119 Type 2 diabetes mellitus without complications: Secondary | ICD-10-CM | POA: Diagnosis not present

## 2024-03-09 DIAGNOSIS — I110 Hypertensive heart disease with heart failure: Secondary | ICD-10-CM | POA: Diagnosis not present

## 2024-03-09 DIAGNOSIS — I509 Heart failure, unspecified: Secondary | ICD-10-CM | POA: Diagnosis not present

## 2024-03-09 DIAGNOSIS — E1169 Type 2 diabetes mellitus with other specified complication: Secondary | ICD-10-CM | POA: Diagnosis not present
